# Patient Record
Sex: Male | Born: 1985 | Race: White | Hispanic: Yes | Marital: Married | Smoking: Never smoker
Health system: Southern US, Community
[De-identification: ages and names within clinical notes are randomized; demographics above are authoritative.]

## PROBLEM LIST (undated history)

## (undated) DIAGNOSIS — K603 Anal fistula: Secondary | ICD-10-CM

---

## 2006-09-06 ENCOUNTER — Emergency Department (HOSPITAL_COMMUNITY): Admission: EM | Admit: 2006-09-06 | Discharge: 2006-09-06 | Payer: Self-pay | Admitting: Emergency Medicine

## 2006-09-12 ENCOUNTER — Encounter (INDEPENDENT_AMBULATORY_CARE_PROVIDER_SITE_OTHER): Payer: Self-pay | Admitting: *Deleted

## 2011-01-06 LAB — DIFFERENTIAL
Basophils Relative: 0
Eosinophils Absolute: 0
Lymphs Abs: 0.9
Neutrophils Relative %: 84 — ABNORMAL HIGH

## 2011-01-06 LAB — URINALYSIS, ROUTINE W REFLEX MICROSCOPIC
Ketones, ur: 15 — AB
Nitrite: NEGATIVE
Protein, ur: NEGATIVE
Urobilinogen, UA: 0.2
pH: 5.5

## 2011-01-06 LAB — COMPREHENSIVE METABOLIC PANEL
ALT: 12
CO2: 25
Calcium: 9.7
GFR calc non Af Amer: >60
Glucose, Bld: 114 — ABNORMAL HIGH
Sodium: 138

## 2011-01-06 LAB — LIPASE, BLOOD: Lipase: 21

## 2011-01-06 LAB — CBC
HCT: 47
Hemoglobin: 15.6
MCHC: 33.3
MCV: 81.4
RBC: 5.77

## 2017-04-13 ENCOUNTER — Ambulatory Visit (HOSPITAL_COMMUNITY)
Admission: EM | Admit: 2017-04-13 | Discharge: 2017-04-13 | Disposition: A | Discharge status: Home / Self Care | Payer: Self-pay | Attending: Family Medicine | Admitting: Family Medicine

## 2017-04-13 ENCOUNTER — Encounter (HOSPITAL_COMMUNITY): Payer: Self-pay | Admitting: Emergency Medicine

## 2017-04-13 DIAGNOSIS — W260XXA Contact with knife, initial encounter: Secondary | ICD-10-CM

## 2017-04-13 DIAGNOSIS — Z23 Encounter for immunization: Secondary | ICD-10-CM

## 2017-04-13 DIAGNOSIS — S61211A Laceration without foreign body of left index finger without damage to nail, initial encounter: Secondary | ICD-10-CM

## 2017-04-13 HISTORY — PX: LACERATION REPAIR: SHX5168

## 2017-04-13 MED ORDER — TETANUS-DIPHTH-ACELL PERTUSSIS 5-2.5-18.5 LF-MCG/0.5 IM SUSP
INTRAMUSCULAR | Status: AC
  Filled 2017-04-13: qty 0.5

## 2017-04-13 MED ORDER — TETANUS-DIPHTH-ACELL PERTUSSIS 5-2.5-18.5 LF-MCG/0.5 IM SUSP
0.5000 mL | Freq: Once | INTRAMUSCULAR | Status: AC
  Administered 2017-04-13: 0.5 mL via INTRAMUSCULAR

## 2017-04-13 NOTE — ED Notes (Signed)
Patient says he will need tetanus

## 2017-04-13 NOTE — ED Provider Notes (Signed)
[facility]    CSN: [id number] Arrival date & time: 04/13/17  1233     History   Chief Complaint Chief Complaint  Patient presents with  . Extremity Laceration    HPI Edward Lewis is a 32 y.o. male presenting with wound to left index finger. He was cutting with a knife and accidentally cut his finger. Accident happened this morning. Bleeding controlled with pressure. No numbness or tingling.   HPI  History reviewed. No pertinent past medical history.  There are no active problems to display for this patient.   History reviewed. No pertinent surgical history.     Home Medications    Prior to Admission medications   Not on File    Family History History reviewed. No pertinent family history.  Social History Social History   Tobacco Use  . Smoking status: Never Smoker  . Smokeless tobacco: Never Used  Substance Use Topics  . Alcohol use: Not on file  . Drug use: Not on file     Allergies   Patient has no known allergies.   Review of Systems Review of Systems  Constitutional: Negative for fatigue and fever.  Skin: Positive for wound. Negative for color change and pallor.  Neurological: Negative for dizziness, syncope, weakness, light-headedness, numbness and headaches.     Physical Exam Triage Vital Signs ED Triage Vitals  Enc Vitals Group     BP 04/13/17 1320 120/75     Pulse Rate 04/13/17 1318 64     Resp 04/13/17 1318 20     Temp 04/13/17 1318 98.4 F (36.9 C)     Temp Source 04/13/17 1318 Oral     SpO2 04/13/17 1318 100 %     Weight --      Height --      Head Circumference --      Peak Flow --      Pain Score 04/13/17 1319 2     Pain Loc --      Pain Edu? --      Excl. in GC? --    No data found.  Updated Vital Signs BP 120/75 (BP Location: Left Arm)   Pulse 66   Temp 98.4 F (36.9 C) (Oral)   Resp 20   SpO2 100%   Visual Acuity Right Eye Distance:   Left Eye Distance:   Bilateral Distance:     Right Eye Near:   Left Eye Near:    Bilateral Near:     Physical Exam  Constitutional: He appears well-developed and well-nourished. No distress.  HENT:  Head: Normocephalic and atraumatic.  Skin:  1 cm area to palmar surface of left index finger- shaved area. Not actively bleeding. Just inferior small 0.5-1 cm cut, 1 mm deep. Skin does not gape with pulling apart, no flap appreciated with movement of edges.      UC Treatments / Results  Labs (all labs ordered are listed, but only abnormal results are displayed) Labs Reviewed - No data to display  EKG  EKG Interpretation None       Radiology No results found.  Procedures Laceration Repair Date/Time: 04/13/2017 2:21 PM Performed by: Levern Kalka, Junius CreamerHallie C, PA-C Authorized by: Mardella LaymanHagler, Brian, MD   Consent:    Consent obtained:  Verbal   Consent given by:  Patient   Risks discussed:  Infection and poor cosmetic result   Alternatives discussed:  No treatment Anesthesia (see MAR for exact dosages):    Anesthesia method:  None Laceration  details:    Location:  Finger   Finger location:  L index finger   Length (cm):  1   Depth (mm):  1 Repair type:    Repair type:  Simple Exploration:    Hemostasis achieved with:  Direct pressure   Wound exploration: wound explored through full range of motion     Wound extent: no foreign bodies/material noted, no muscle damage noted and no tendon damage noted     Contaminated: no   Treatment:    Area cleansed with:  Shur-Clens   Amount of cleaning:  Standard Skin repair:    Repair method: Derma bond and steri strips. Post-procedure details:    Dressing:  Non-adherent dressing Comments:     Shaved area with dermabond applied and steri-strips over inferior laceration   (including critical care time)  Medications Ordered in UC Medications  Tdap (BOOSTRIX) injection 0.5 mL (0.5 mLs Intramuscular Given 04/13/17 1415)     Initial Impression / Assessment and Plan / UC Course  I  have reviewed the triage vital signs and the nursing notes.  Pertinent labs & imaging results that were available during my care of the patient were reviewed by me and considered in my medical decision making (see chart for details).     Wounds not deep enough for suturing, expect healing with steri strips/derma bond. Advised to keep dry and clean. Discussed strict return precautions. Patient verbalized understanding and is agreeable with plan.   Final Clinical Impressions(s) / UC Diagnoses   Final diagnoses:  Laceration of left index finger without foreign body without damage to nail, initial encounter    ED Discharge Orders    None       Controlled Substance Prescriptions [place] Controlled Substance Registry consulted? Not Applicable   [name], [place] 04/13/17 1424

## 2017-04-13 NOTE — ED Triage Notes (Signed)
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 Has a pressure dressing and bleeding is controlled.   Last tetanus = unknown   TAKING MEDS: none   A&O x4... NAD... Ambulatory

## 2017-04-13 NOTE — Discharge Instructions (Signed)
We have updated your tetanus shot today.   I have applied steri strips and skin glue over to protect. Cut was not deep enough for stiches. Do not pull steri strips off, they will fall off on their own in 5-7 days.   Keep area clean and dry.  Please return if you notice increased redness, swelling, or drainage from the area.

## 2017-12-06 ENCOUNTER — Encounter (HOSPITAL_COMMUNITY): Payer: Self-pay

## 2017-12-06 ENCOUNTER — Ambulatory Visit (HOSPITAL_COMMUNITY)
Admission: EM | Admit: 2017-12-06 | Discharge: 2017-12-06 | Disposition: A | Discharge status: Home / Self Care | Payer: Self-pay | Attending: Family Medicine | Admitting: Family Medicine

## 2017-12-06 ENCOUNTER — Other Ambulatory Visit: Payer: Self-pay

## 2017-12-06 DIAGNOSIS — M25562 Pain in left knee: Secondary | ICD-10-CM

## 2017-12-06 DIAGNOSIS — M7042 Prepatellar bursitis, left knee: Secondary | ICD-10-CM

## 2017-12-06 MED ORDER — DOXYCYCLINE HYCLATE 100 MG PO CAPS: 100.0000 mg | ORAL_CAPSULE | Freq: Two times a day (BID) | ORAL | 0 refills | Status: DC

## 2017-12-06 MED ORDER — CEPHALEXIN 500 MG PO CAPS: 500.0000 mg | ORAL_CAPSULE | Freq: Four times a day (QID) | ORAL | 0 refills | Status: DC

## 2017-12-06 MED ORDER — NAPROXEN 500 MG PO TABS: 500.0000 mg | ORAL_TABLET | Freq: Two times a day (BID) | ORAL | 0 refills | Status: DC

## 2017-12-06 NOTE — ED Triage Notes (Signed)
Pt states he has pain in his left knee this started last Friday. 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

## 2017-12-06 NOTE — ED Provider Notes (Signed)
[facility]    CSN: [id number] Arrival date & time: 12/06/17  4696     History   Chief Complaint Chief Complaint  Patient presents with  . Knee Pain    HPI Spanish interpretation via [employer] interpreter Edward Lewis is a 32 y.o. male [place] past medical history presenting today for evaluation of left knee pain and swelling.  Patient states that symptoms have been like this for the past 5 days.  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  He has been taking ibuprofen which has relieved his pain, but feels as if the swelling is worsened and redness is spread.  He is concerned about infection.  Denies difficulty moving the knee.   HPI  History reviewed. No pertinent past medical history.  There are no active problems to display for this patient.   History reviewed. No pertinent surgical history.     Home Medications    Prior to Admission medications   Medication Sig Start Date End Date Taking? Authorizing Provider  cephALEXin (KEFLEX) 500 MG capsule Take 1 capsule (500 mg total) by mouth 4 (four) times daily for 5 days. 12/06/17 12/11/17  Benigno Check C, PA-C  naproxen (NAPROSYN) 500 MG tablet Take 1 tablet (500 mg total) by mouth 2 (two) times daily. 12/06/17   Raea Magallon, Junius Creamer, PA-C    Family History History reviewed. No pertinent family history.  Social History Social History   Tobacco Use  . Smoking status: Never Smoker  . Smokeless tobacco: Never Used  Substance Use Topics  . Alcohol use: Not on file  . Drug use: Not on file     Allergies   Patient has no known allergies.   Review of Systems Review of Systems  Constitutional: Negative for fatigue and fever.  Eyes: Negative for redness, itching and visual disturbance.  Respiratory: Negative for shortness of  breath.   Cardiovascular: Negative for chest pain and leg swelling.  Gastrointestinal: Negative for nausea and vomiting.  Musculoskeletal: Positive for arthralgias and joint swelling. Negative for myalgias.  Skin: Positive for color change. Negative for rash and wound.  Neurological: Negative for dizziness, syncope, weakness, light-headedness and headaches.     Physical Exam Triage Vital Signs ED Triage Vitals  Enc Vitals Group     BP 12/06/17 0946 122/88     Pulse Rate 12/06/17 0946 84     Resp 12/06/17 0946 18     Temp 12/06/17 0946 99.6 F (37.6 C)     Temp Source 12/06/17 0946 Oral     SpO2 12/06/17 0946 100 %     Weight 12/06/17 1018 208 lb (94.3 kg)     Height --      Head Circumference --      Peak Flow --      Pain Score --      Pain Loc --      Pain Edu? --      Excl. in GC? --    No data found.  Updated Vital Signs BP 122/88 (BP Location: Right Arm)   Pulse 84   Temp 99.6 F (37.6 C) (Oral)   Resp 18   Wt 208 lb (94.3 kg)   SpO2 100%   Visual Acuity Right Eye Distance:   Left Eye Distance:   Bilateral Distance:  Right Eye Near:   Left Eye Near:    Bilateral Near:     Physical Exam  Constitutional: He appears well-developed and well-nourished.  HENT:  Head: Normocephalic and atraumatic.  Eyes: Conjunctivae are normal.  Neck: Neck supple.  Cardiovascular: Normal rate and regular rhythm.  No murmur heard. Pulmonary/Chest: Effort normal and breath sounds normal. No respiratory distress.  Abdominal: Soft. There is no tenderness.  Musculoskeletal: He exhibits edema and tenderness.  Left knee with swelling, erythema and warmth, swelling and slight fluctuance above left patellar area, erythema does extend slightly to distal shin full active range of motion of knee  Neurological: He is alert.  Skin: Skin is warm and dry.  Psychiatric: He has a normal mood and affect.  Nursing note and vitals reviewed.    UC Treatments / Results  Labs (all labs  ordered are listed, but only abnormal results are displayed) Labs Reviewed - No data to display  EKG None  Radiology No results found.  Procedures Procedures (including critical care time)  Medications Ordered in UC Medications - No data to display  Initial Impression / Assessment and Plan / UC Course  I have reviewed the triage vital signs and the nursing notes.  Pertinent labs & imaging results that were available during my care of the patient were reviewed by me and considered in my medical decision making (see chart for details).     Patient appears to have a prepatellar bursitis of his left knee, does not seem to be swollen enough to aspirate at this time.  Will recommend anti-inflammatories, will provide Ace wrap and also recommending icing and resting.  Given warmth and extension of erythema onto shin will also begin patient on Keflex to cover for cellulitis.  Vital signs stable, full active range of motion, unlikely septic joint.  Will have patient follow-up if developing any decreased range of motion, redness spreading, worsening pain or swelling.Discussed strict return precautions. Patient verbalized understanding and is agreeable with plan.  Final Clinical Impressions(s) / UC Diagnoses   Final diagnoses:  Acute pain of left knee  Prepatellar bursitis of left knee     Discharge Instructions     Use anti-inflammatories for pain/swelling. You may take up to 800 mg Ibuprofen every 8 hours OR naprosyn twice daily with food. You may supplement Ibuprofen with Tylenol [phone] mg every 8 hours.  Ace wrap to help with swelling Ice and elevate when resting at home Begin Keflex every 6 hours for 5 days  Follow up if pain, redness and swelling worsening   ED Prescriptions    Medication Sig Dispense Auth. Provider   naproxen (NAPROSYN) 500 MG tablet Take 1 tablet (500 mg total) by mouth 2 (two) times daily. 30 tablet Destynie Toomey C, PA-C   doxycycline (VIBRAMYCIN) 100 MG  capsule  (Status: Discontinued) Take 1 capsule (100 mg total) by mouth 2 (two) times daily for 7 days. 14 capsule Chrishawna Farina C, PA-C   cephALEXin (KEFLEX) 500 MG capsule Take 1 capsule (500 mg total) by mouth 4 (four) times daily for 5 days. 20 capsule Tip Atienza C, PA-C     Controlled Substance Prescriptions [employer] Controlled Substance Registry consulted? Not Applicable   Lew DawesWieters, Geselle Cardosa C, [place] 12/06/17 1617

## 2017-12-06 NOTE — Discharge Instructions (Signed)
Use anti-inflammatories for pain/swelling. You may take up to 800 mg Ibuprofen every 8 hours OR naprosyn twice daily with food. You may supplement Ibuprofen with Tylenol [phone] mg every 8 hours.  Ace wrap to help with swelling Ice and elevate when resting at home Begin Keflex every 6 hours for 5 days  Follow up if pain, redness and swelling worsening

## 2017-12-11 ENCOUNTER — Emergency Department (HOSPITAL_COMMUNITY)
Admission: EM | Admit: 2017-12-11 | Discharge: 2017-12-11 | Disposition: A | Discharge status: Home / Self Care | Payer: Self-pay | Attending: Emergency Medicine | Admitting: Emergency Medicine

## 2017-12-11 ENCOUNTER — Emergency Department (HOSPITAL_COMMUNITY): Payer: Self-pay

## 2017-12-11 ENCOUNTER — Other Ambulatory Visit: Payer: Self-pay

## 2017-12-11 ENCOUNTER — Encounter (HOSPITAL_COMMUNITY): Payer: Self-pay | Admitting: Emergency Medicine

## 2017-12-11 DIAGNOSIS — Z79899 Other long term (current) drug therapy: Secondary | ICD-10-CM | POA: Insufficient documentation

## 2017-12-11 DIAGNOSIS — M7042 Prepatellar bursitis, left knee: Secondary | ICD-10-CM | POA: Insufficient documentation

## 2017-12-11 DIAGNOSIS — Y939 Activity, unspecified: Secondary | ICD-10-CM | POA: Insufficient documentation

## 2017-12-11 MED ORDER — DOXYCYCLINE HYCLATE 100 MG PO CAPS: 100.0000 mg | ORAL_CAPSULE | Freq: Two times a day (BID) | ORAL | 0 refills | Status: DC

## 2017-12-11 NOTE — ED Provider Notes (Signed)
[facility] EMERGENCY DEPARTMENT Provider Note   CSN: [id number] Arrival date & time: 12/11/17  1513     History   Chief Complaint Chief Complaint  Patient presents with  . Knee Pain    HPI Foxx Klarich is a 32 y.o. male.  Patient is a 32 year old male who presents with left knee pain.  He states it started hurting about a week ago.  He has had some swelling and redness over his left kneecap.  He denies any fevers.  He states it does not hurt when it is just lying there but when he starts walking notes when it sore.  He does work on his knees a lot at work.  He was seen in urgent care on September 17 and diagnosed with pre-patellar bursitis.  He was started on Keflex.  He states it has not gotten any worse but has not really gotten any better.  He is also taking Naprosyn.  He denies any fevers.  No increased swelling of the knee.  No history of similar problems in the past.  No other joint swelling.     History reviewed. No pertinent past medical history.  There are no active problems to display for this patient.   History reviewed. No pertinent surgical history.      Home Medications    Prior to Admission medications   Medication Sig Start Date End Date Taking? Authorizing Provider  doxycycline (VIBRAMYCIN) 100 MG capsule Take 1 capsule (100 mg total) by mouth 2 (two) times daily. One po bid x 7 days 12/11/17   Rolan Bucco, MD  naproxen (NAPROSYN) 500 MG tablet Take 1 tablet (500 mg total) by mouth 2 (two) times daily. 12/06/17   Wieters, Junius Creamer, PA-C    Family History No family history on file.  Social History Social History   Tobacco Use  . Smoking status: Never Smoker  . Smokeless tobacco: Never Used  Substance Use Topics  . Alcohol use: Yes  . Drug use: Never     Allergies   Patient has no known allergies.   Review of Systems Review of Systems  Constitutional: Negative for fever.  Gastrointestinal: Negative for  nausea and vomiting.  Musculoskeletal: Positive for arthralgias and joint swelling. Negative for back pain and neck pain.  Skin: Negative for wound.  Neurological: Negative for weakness, numbness and headaches.     Physical Exam Updated Vital Signs BP 110/70 (BP Location: Right Arm)   Pulse 73   Temp 98.5 F (36.9 C) (Oral)   Resp 16   Ht 5\' 4"  (1.626 m)   Wt 95.3 kg   SpO2 100%   BMI 36.05 kg/m   Physical Exam  Constitutional: He is oriented to person, place, and time. He appears well-developed and well-nourished.  HENT:  Head: Normocephalic and atraumatic.  Neck: Normal range of motion. Neck supple.  Cardiovascular: Normal rate.  Pulmonary/Chest: Effort normal.  Musculoskeletal: He exhibits edema and tenderness.  Patient has some mild swelling over the left patellar area.  There is no tenderness on palpation.  No wounds are noted.  No significant joint effusion is palpated.  Ligaments are grossly intact.  Is able to do a straight leg raise.  He has no pain on range of motion of the knee.  He has some mild swelling as well extending down into his lower leg and up into his thigh but no redness in these areas.  He is neurovascular intact distally.  Neurological: He is alert  and oriented to person, place, and time.  Skin: Skin is warm and dry.  Psychiatric: He has a normal mood and affect.       ED Treatments / Results  Labs (all labs ordered are listed, but only abnormal results are displayed) Labs Reviewed - No data to display  EKG None  Radiology Dg Knee Complete 4 Views Left  Result Date: 12/11/2017 CLINICAL DATA:  Anterior left knee pain and swelling. EXAM: LEFT KNEE - COMPLETE 4+ VIEW COMPARISON:  None. FINDINGS: Anterior soft tissue swelling overlying the patella and infrapatellar region. No bony abnormality. No fracture, subluxation or dislocation. No joint effusion. IMPRESSION: Anterior soft tissue swelling.  No acute bony abnormality. Electronically Signed    By: Charlett NoseKevin  Dover M.D.   On: 12/11/2017 17:46    Procedures Procedures (including critical care time)  Medications Ordered in ED Medications - No data to display   Initial Impression / Assessment and Plan / ED Course  I have reviewed the triage vital signs and the nursing notes.  Pertinent labs & imaging results that were available during my care of the patient were reviewed by me and considered in my medical decision making (see chart for details).     Patient has mild swelling to the anterior left knee.  I doubt that this is septic.  There is no tenderness on palpation of the area.  There is no indication of septic joint.  I do not really palpate significant fluid to the area that can be aspirated.  Potentially he could have cellulitis but I feel this is also a low possibility given that it is really not tender on exam.  I feel this is more likely inflammatory process.  However I did switch him from Keflex to doxycycline.  I advised him to ice the area.  He was given Ace wrap for compression.  He will continue Naprosyn.  He was given a referral to follow-up with orthopedics.  He was given strict return precautions.  Final Clinical Impressions(s) / ED Diagnoses   Final diagnoses:  Prepatellar bursitis of left knee    ED Discharge Orders         Ordered    doxycycline (VIBRAMYCIN) 100 MG capsule  2 times daily     12/11/17 1820           Rolan BuccoBelfi, Lonzy Mato, MD 12/11/17 1827

## 2017-12-11 NOTE — ED Triage Notes (Signed)
C/o pain and swelling to L knee since 9/13.  [facility] with prepatellar burs[facility].  Taking Naproxen and Keflex without any relief.

## 2017-12-11 NOTE — ED Notes (Signed)
Patient verbalizes understanding of medications and discharge instructions. No further questions at this time. VSS and patient ambulatory at discharge.   

## 2018-01-14 ENCOUNTER — Emergency Department (HOSPITAL_COMMUNITY)
Admission: EM | Admit: 2018-01-14 | Discharge: 2018-01-14 | Disposition: A | Discharge status: Home / Self Care | Payer: Self-pay | Attending: Emergency Medicine | Admitting: Emergency Medicine

## 2018-01-14 ENCOUNTER — Encounter (HOSPITAL_COMMUNITY): Payer: Self-pay | Admitting: Emergency Medicine

## 2018-01-14 ENCOUNTER — Emergency Department (HOSPITAL_COMMUNITY): Payer: Self-pay

## 2018-01-14 DIAGNOSIS — K6289 Other specified diseases of anus and rectum: Secondary | ICD-10-CM | POA: Insufficient documentation

## 2018-01-14 LAB — URINALYSIS, ROUTINE W REFLEX MICROSCOPIC
BILIRUBIN URINE: NEGATIVE
GLUCOSE, UA: NEGATIVE mg/dL
Hgb urine dipstick: NEGATIVE
KETONES UR: NEGATIVE mg/dL
Leukocytes, UA: NEGATIVE
NITRITE: NEGATIVE
PH: 6 (ref 5.0–8.0)
Protein, ur: NEGATIVE mg/dL
Specific Gravity, Urine: 1.019 (ref 1.005–1.030)

## 2018-01-14 LAB — BASIC METABOLIC PANEL
ANION GAP: 10 (ref 5–15)
BUN: 12 mg/dL (ref 6–20)
CALCIUM: 9.4 mg/dL (ref 8.9–10.3)
CO2: 27 mmol/L (ref 22–32)
Chloride: 101 mmol/L (ref 98–111)
Creatinine, Ser: 0.81 mg/dL (ref 0.61–1.24)
GLUCOSE: 100 mg/dL — AB (ref 70–99)
Potassium: 3.7 mmol/L (ref 3.5–5.1)
Sodium: 138 mmol/L (ref 135–145)

## 2018-01-14 LAB — CBC WITH DIFFERENTIAL/PLATELET
Abs Immature Granulocytes: 0.04 10*3/uL (ref 0.00–0.07)
BASOS ABS: 0 10*3/uL (ref 0.0–0.1)
BASOS PCT: 0 %
EOS ABS: 0 10*3/uL (ref 0.0–0.5)
Eosinophils Relative: 0 %
HEMATOCRIT: 45 % (ref 39.0–52.0)
Hemoglobin: 14.2 g/dL (ref 13.0–17.0)
IMMATURE GRANULOCYTES: 0 %
LYMPHS ABS: 2.4 10*3/uL (ref 0.7–4.0)
Lymphocytes Relative: 22 %
MCH: 27 pg (ref 26.0–34.0)
MCHC: 31.6 g/dL (ref 30.0–36.0)
MCV: 85.6 fL (ref 80.0–100.0)
Monocytes Absolute: 0.9 10*3/uL (ref 0.1–1.0)
Monocytes Relative: 8 %
NEUTROS PCT: 70 %
NRBC: 0 % (ref 0.0–0.2)
Neutro Abs: 7.6 10*3/uL (ref 1.7–7.7)
PLATELETS: 316 10*3/uL (ref 150–400)
RBC: 5.26 MIL/uL (ref 4.22–5.81)
RDW: 13.8 % (ref 11.5–15.5)
WBC: 11 10*3/uL — AB (ref 4.0–10.5)

## 2018-01-14 MED ORDER — IOPAMIDOL (ISOVUE-300) INJECTION 61%
100.0000 mL | Freq: Once | INTRAVENOUS | Status: AC | PRN
  Administered 2018-01-14: 100 mL via INTRAVENOUS

## 2018-01-14 MED ORDER — HYDROCORTISONE ACETATE 25 MG RE SUPP: 25.0000 mg | Freq: Two times a day (BID) | RECTAL | 0 refills | Status: DC

## 2018-01-14 MED ORDER — OXYCODONE-ACETAMINOPHEN 5-325 MG PO TABS: 1.0000 | ORAL_TABLET | Freq: Four times a day (QID) | ORAL | 0 refills | Status: DC | PRN

## 2018-01-14 MED ORDER — IOPAMIDOL (ISOVUE-300) INJECTION 61%
INTRAVENOUS | Status: AC
  Filled 2018-01-14: qty 100

## 2018-01-14 MED ORDER — SODIUM CHLORIDE 0.9 % IJ SOLN
INTRAMUSCULAR | Status: AC
  Filled 2018-01-14: qty 50

## 2018-01-14 NOTE — ED Provider Notes (Signed)
[facility]-EMERGENCY DEPT Provider Note   CSN: [id number] Arrival date & time: 01/14/18  1224     History   Chief Complaint Chief Complaint  Patient presents with  . Rectal Pain    HPI Cebert Dettmann is a 32 y.o. male.  HPI   Valgene Deloatch is a 32 year old male with no significant past medical history who presents to the emergency department for evaluation of rectal pain.  Patient reports that he has had gradually worsening rectal pain for the past 3 weeks.  Pain is constant and feels "hot."  Worse with sitting upright, walking or having a bowel movement.  He reports history of chronic diarrhea with 4 episodes of nonbloody diarrhea daily, denies any change from his baseline.  He had a bowel movement earlier today which was normal for him.  He denies fever, chills, nausea/vomiting, abdominal pain, testicular pain or scrotal swelling, chest pain, shortness of breath.   History reviewed. No pertinent past medical history.  There are no active problems to display for this patient.   History reviewed. No pertinent surgical history.      Home Medications    Prior to Admission medications   Medication Sig Start Date End Date Taking? Authorizing Provider  acetaminophen (TYLENOL) 500 MG tablet Take 500 mg by mouth every 6 (six) hours as needed for mild pain.   Yes [provider]  naproxen (NAPROSYN) 500 MG tablet Take 1 tablet (500 mg total) by mouth 2 (two) times daily. 12/06/17  Yes Wieters, Hallie C, PA-C  phenylephrine-shark liver oil-mineral oil-petrolatum (PREPARATION H) 0.25-3-14-71.9 % rectal ointment Place 1 application rectally 2 (two) times daily as needed for hemorrhoids.   Yes [provider]  doxycycline (VIBRAMYCIN) 100 MG capsule Take 1 capsule (100 mg total) by mouth 2 (two) times daily. One po bid x 7 days Patient not taking: Reported on 01/14/2018 12/11/17   Rolan Bucco, MD    Family History No family history  on file.  Social History Social History   Tobacco Use  . Smoking status: Never Smoker  . Smokeless tobacco: Never Used  Substance Use Topics  . Alcohol use: Yes  . Drug use: Never     Allergies   Patient has no known allergies.   Review of Systems Review of Systems  Constitutional: Negative for chills and fever.  Respiratory: Negative for shortness of breath.   Cardiovascular: Negative for chest pain.  Gastrointestinal: Positive for diarrhea and rectal pain. Negative for abdominal pain, anal bleeding, blood in stool, nausea and vomiting.  Genitourinary: Negative for difficulty urinating, dysuria, frequency, hematuria, scrotal swelling and testicular pain.  Musculoskeletal: Negative for back pain.  Skin: Negative for color change.  Psychiatric/Behavioral: Negative for agitation.  All other systems reviewed and are negative.    Physical Exam Updated Vital Signs BP 122/85 (BP Location: Left Arm)   Pulse 74   Temp 98.4 F (36.9 C) (Oral)   Resp 18   SpO2 99%   Physical Exam  Constitutional: He is oriented to person, place, and time. He appears well-developed and well-nourished. No distress.  No acute distress, non-toxic appearing.   HENT:  Head: Normocephalic and atraumatic.  Mouth/Throat: Oropharynx is clear and moist.  Mucous membranes moist.   Eyes: Right eye exhibits no discharge. Left eye exhibits no discharge.  Neck: Normal range of motion.  Cardiovascular: Normal rate and regular rhythm.  Pulmonary/Chest: Effort normal and breath sounds normal. No stridor. No respiratory distress. He has no wheezes. He  has no rales.  Abdominal:  Abdomen soft and non-distended. Bowel sounds normoactive. Abdomen non-tender to palpation.   Genitourinary:     Genitourinary Comments: Chaperone present. Tender to palpation left of anus. There is no overlying rash on the skin, palpable mass or area of fluctuance. No visible hemorrhoids or fissures. Internal exam with significant  perirectal tenderness on the left.   Musculoskeletal: Normal range of motion.  Neurological: He is alert and oriented to person, place, and time. Coordination normal.  Skin: Skin is warm and dry. He is not diaphoretic.  Psychiatric: He has a normal mood and affect. His behavior is normal.  Nursing note and vitals reviewed.    ED Treatments / Results  Labs (all labs ordered are listed, but only abnormal results are displayed) Labs Reviewed  CBC WITH DIFFERENTIAL/PLATELET - Abnormal; Notable for the following components:      Result Value   WBC 11.0 (*)    All other components within normal limits  BASIC METABOLIC PANEL - Abnormal; Notable for the following components:   Glucose, Bld 100 (*)    All other components within normal limits  URINALYSIS, ROUTINE W REFLEX MICROSCOPIC    EKG None  Radiology Ct Pelvis W Contrast  Result Date: 01/14/2018 CLINICAL DATA:  Pt c/o left side rectal pain for 2 weeks worse with sitting and walking. Tried tylenol and cream for hemorrhoids but didn't help. Denies blood in stools or rectal bleeding. EXAM: CT PELVIS WITH CONTRAST TECHNIQUE: Multidetector CT imaging of the pelvis was performed using the standard protocol following the bolus administration of intravenous contrast. CONTRAST:  ISOVUE-300 IOPAMIDOL (ISOVUE-300) INJECTION 61% COMPARISON:  None. FINDINGS: Urinary Tract: Bladder is unremarkable. Distal ureters normal in course and in caliber. Bowel: No rectal wall thickening or adjacent inflammation. Visualized colon and small bowel are unremarkable. Normal appendix visualized. There is a subtle low-density lesion projecting within the anus measuring 12 mm the may be due to a hemorrhoid. Vascular/Lymphatic: No vascular abnormality. No pathologically enlarged lymph nodes. Reproductive:  Normal sized prostate gland. Other:  None. Musculoskeletal: No skeletal abnormality. IMPRESSION: 1. Small apparent cystic lesion measuring 12 mm projects within  the anus. This could potentially reflect an inflamed anal gland or hemorrhoid. 2. No other abnormality.  No rectal wall thickening or inflammation. Electronically Signed   By: Amie Portland M.D.   On: 01/14/2018 19:49    Procedures Procedures (including critical care time)  Medications Ordered in ED Medications  iopamidol (ISOVUE-300) 61 % injection 100 mL (100 mLs Intravenous Contrast Given 01/14/18 1931)     Initial Impression / Assessment and Plan / ED Course  I have reviewed the triage vital signs and the nursing notes.  Pertinent labs & imaging results that were available during my care of the patient were reviewed by me and considered in my medical decision making (see chart for details).     Patient presents with rectal pain ongoing for 3wks, worse with bowel movement. Denies constipation. No fevers, n/v, hematochezia or melena. On exam he has significant left sided perirectal tenderness without visible or palpable mass or hemorrhoid. No abdominal tenderness. Lab work with a slight leukocytosis of 11.0. CT pelvis ordered given concern for perirectal abscess. This reveals small cystic lesion measuring 1.2cm within the anus which is likely inflamed anal gland vs. hemorrhoid. Plan to treat with steroid suppository and sitz bath. Will also d/c with very short course of percocet for break through pain. Discussed return precautions and he agrees.   Final  Clinical Impressions(s) / ED Diagnoses   Final diagnoses:  Rectal pain    ED Discharge Orders         Ordered    hydrocortisone (ANUSOL-HC) 25 MG suppository  2 times daily     01/14/18 2104    oxyCODONE-acetaminophen (PERCOCET/ROXICET) 5-325 MG tablet  Every 6 hours PRN     01/14/18 2104           Kellie Shropshire, PA-C 01/15/18 2229    Alvira Monday, MD 01/18/18 2134

## 2018-01-14 NOTE — Discharge Instructions (Addendum)
Scan shows what appears to be a small hemorrhoid inside your anus.   Sit in warm water for 15 minutes twice a day to help with your symptoms.   Use rectal suppository up to twice a day. I have attached a coupon- you have to go to [employer] to redeem this coupon. I have also written you a prescription for percocet. Remember the percocet can make you sleepy so please do not drive, work or drink alcohol while taking.   Come back to the ER if you have any new or worsening symptoms like fever, vomiting, abdominal pain.

## 2018-01-14 NOTE — ED Notes (Signed)
Family at bedside. 

## 2018-01-14 NOTE — ED Triage Notes (Signed)
Pt c/o left side rectal pain for 2 weeks worse with sitting and walking. Tried tylenol and cream for hemorrhoids but didn't help. Denies blood in stools or rectal bleeding.

## 2018-02-02 ENCOUNTER — Other Ambulatory Visit: Payer: Self-pay

## 2018-02-02 ENCOUNTER — Encounter (HOSPITAL_COMMUNITY): Payer: Self-pay

## 2018-02-02 ENCOUNTER — Ambulatory Visit (HOSPITAL_COMMUNITY)
Admission: EM | Admit: 2018-02-02 | Discharge: 2018-02-02 | Disposition: A | Discharge status: Home / Self Care | Payer: Self-pay | Attending: Family Medicine | Admitting: Family Medicine

## 2018-02-02 DIAGNOSIS — K611 Rectal abscess: Secondary | ICD-10-CM | POA: Insufficient documentation

## 2018-02-02 DIAGNOSIS — Z79899 Other long term (current) drug therapy: Secondary | ICD-10-CM | POA: Insufficient documentation

## 2018-02-02 DIAGNOSIS — L0291 Cutaneous abscess, unspecified: Secondary | ICD-10-CM

## 2018-02-02 DIAGNOSIS — Z791 Long term (current) use of non-steroidal anti-inflammatories (NSAID): Secondary | ICD-10-CM | POA: Insufficient documentation

## 2018-02-02 MED ORDER — DOXYCYCLINE HYCLATE 100 MG PO CAPS: 100.0000 mg | ORAL_CAPSULE | Freq: Two times a day (BID) | ORAL | 0 refills | Status: DC

## 2018-02-02 NOTE — Discharge Instructions (Signed)
Soak bottom in warm water twice a day to promote drainage.  Complete course of antibiotics.  Please follow up with a primary care provider and/or surgery in the next 1 week for a recheck as if not improving may need further intervention.  If develop increased pain, fevers, nausea, vomiting, unable to have a BM or otherwise worsening please go to the Er.

## 2018-02-02 NOTE — ED Triage Notes (Signed)
Pt states she went to the ER 2 weeks ago for his hemorrhoids and sense then he thinks he had a boil to burst near his anus and now is draining.

## 2018-02-02 NOTE — ED Provider Notes (Signed)
[facility]    CSN: [id number] Arrival date & time: 02/02/18  [id number]     History   Chief Complaint Chief Complaint  Patient presents with  . Abscess    HPI Edward Lewis is a 32 y.o. male.   Edward Lewis presents with complaints of draining boil near rectum. Was seen and evaluated in ED 10/26 due to rectal pain. Slight elevation in WBC at that time, CT completed. Area of concern for enlarged anal gland vs hemorrhoid. Patient sent home with supportive cares, hydrocortisone suppositories and sitz baths. Patient states that the area began to swell a few days later and then started draining. Has had drainage for approximately a week. No longer with pain. No fevers or chills. BM's have been normal, no change. Denies any previous similar. Sexually active with wife, denies anal intercourse. Has been using topical antibiotic cream. Without contributing medical history.      ,ROS per HPI     History reviewed. No pertinent past medical history.  There are no active problems to display for this patient.   History reviewed. No pertinent surgical history.     Home Medications    Prior to Admission medications   Medication Sig Start Date End Date Taking? Authorizing Provider  acetaminophen (TYLENOL) 500 MG tablet Take 500 mg by mouth every 6 (six) hours as needed for mild pain.    [provider]  doxycycline (VIBRAMYCIN) 100 MG capsule Take 1 capsule (100 mg total) by mouth 2 (two) times daily. 02/02/18   Georgetta HaberBurky, Natalie B, NP  hydrocortisone (ANUSOL-HC) 25 MG suppository Place 1 suppository (25 mg total) rectally 2 (two) times daily. 01/14/18   Kellie ShropshireShrosbree, Emily J, PA-C  naproxen (NAPROSYN) 500 MG tablet Take 1 tablet (500 mg total) by mouth 2 (two) times daily. 12/06/17   Wieters, Hallie C, PA-C  oxyCODONE-acetaminophen (PERCOCET/ROXICET) 5-325 MG tablet Take 1 tablet by mouth every 6 (six) hours as needed for severe pain. 01/14/18   Kellie ShropshireShrosbree, Emily J, PA-C    phenylephrine-shark liver oil-mineral oil-petrolatum (PREPARATION H) 0.25-3-14-71.9 % rectal ointment Place 1 application rectally 2 (two) times daily as needed for hemorrhoids.    [provider]    Family History History reviewed. No pertinent family history.  Social History Social History   Tobacco Use  . Smoking status: Never Smoker  . Smokeless tobacco: Never Used  Substance Use Topics  . Alcohol use: Yes  . Drug use: Never     Allergies   Patient has no known allergies.   Review of Systems Review of Systems   Physical Exam Triage Vital Signs ED Triage Vitals  Enc Vitals Group     BP 02/02/18 1034 117/80     Pulse Rate 02/02/18 1034 84     Resp 02/02/18 1034 18     Temp 02/02/18 1034 98.3 F (36.8 C)     Temp Source 02/02/18 1034 Oral     SpO2 02/02/18 1034 99 %     Weight 02/02/18 1035 205 lb (93 kg)     Height --      Head Circumference --      Peak Flow --      Pain Score 02/02/18 1035 0     Pain Loc --      Pain Edu? --      Excl. in GC? --    No data found.  Updated Vital Signs BP 117/80 (BP Location: Left Arm)   Pulse 84   Temp 98.3 F (  36.8 C) (Oral)   Resp 18   Wt 205 lb (93 kg)   SpO2 99%   BMI 35.19 kg/m    Physical Exam  Constitutional: He is oriented to person, place, and time. He appears well-developed and well-nourished.  Cardiovascular: Normal rate and regular rhythm.  Pulmonary/Chest: Effort normal and breath sounds normal.  Genitourinary: Rectal exam shows no external hemorrhoid.     Genitourinary Comments: Chaperone present;  soft area left of anus without palpable fluctuance or induration but thick white discharge expressed on palpation; non tender; culture obtained   Neurological: He is alert and oriented to person, place, and time.  Skin: Skin is warm and dry.     UC Treatments / Results  Labs (all labs ordered are listed, but only abnormal results are displayed) Labs Reviewed  AEROBIC CULTURE  (SUPERFICIAL SPECIMEN)    EKG None  Radiology No results found.  Procedures Procedures (including critical care time)  Medications Ordered in UC Medications - No data to display  Initial Impression / Assessment and Plan / UC Course  I have reviewed the triage vital signs and the nursing notes.  Pertinent labs & imaging results that were available during my care of the patient were reviewed by me and considered in my medical decision making (see chart for details).     Draining perirectal abscess, suspect this is what was visualized on CT. No fevers, no tachycardia, pain has improved since draining. abx initiated. Encouraged continued sitz baths. Encouraged close follow up with PCP and/or gen surg as may need I&d if no improvement or if worsening. Return precautions provided. Patient verbalized understanding and agreeable to plan.    Final Clinical Impressions(s) / UC Diagnoses   Final diagnoses:  Abscess     Discharge Instructions     Soak bottom in warm water twice a day to promote drainage.  Complete course of antibiotics.  Please follow up with a primary care provider and/or surgery in the next 1 week for a recheck as if not improving may need further intervention.  If develop increased pain, fevers, nausea, vomiting, unable to have a BM or otherwise worsening please go to the Er.    ED Prescriptions    Medication Sig Dispense Auth. Provider   doxycycline (VIBRAMYCIN) 100 MG capsule Take 1 capsule (100 mg total) by mouth 2 (two) times daily. 20 capsule Georgetta Haber, NP     Controlled Substance Prescriptions [place] Controlled Substance Registry consulted? Not Applicable   Georgetta Haber, NP 02/02/18 1106

## 2018-02-05 LAB — AEROBIC CULTURE W GRAM STAIN (SUPERFICIAL SPECIMEN)

## 2018-02-05 LAB — AEROBIC CULTURE  (SUPERFICIAL SPECIMEN)

## 2018-02-26 ENCOUNTER — Encounter (HOSPITAL_COMMUNITY): Payer: Self-pay | Admitting: Emergency Medicine

## 2018-02-26 ENCOUNTER — Emergency Department (HOSPITAL_COMMUNITY)
Admission: EM | Admit: 2018-02-26 | Discharge: 2018-02-26 | Disposition: A | Discharge status: Home / Self Care | Payer: Self-pay | Attending: Emergency Medicine | Admitting: Emergency Medicine

## 2018-02-26 DIAGNOSIS — K603 Anal fistula: Secondary | ICD-10-CM | POA: Insufficient documentation

## 2018-02-26 DIAGNOSIS — Z79899 Other long term (current) drug therapy: Secondary | ICD-10-CM | POA: Insufficient documentation

## 2018-02-26 NOTE — ED Notes (Signed)
Pt. Is in no distress. Dr. Ethelda ChickJacubowitz has a lengthy conversation and exam utilizing the [employer] translator.

## 2018-02-26 NOTE — Discharge Instructions (Signed)
Call the [facility] tomorrow to schedule an office appointment.  Tell office staff that you were seen in the emergency department

## 2018-02-26 NOTE — ED Provider Notes (Signed)
[facility]-EMERGENC[facility]PT Provider Note   CSN: [id number] Arrival date & time: 02/26/18  0807    History is obtained through medical interpreter patient speaks little English History   Chief Complaint Chief Complaint  Patient presents with  . rectal drainage    HPI Edward Lewis is a 32 y.o. male.  HPI patient noted a "boil" immediately lateral to the anus October 2019, which subsequently "popped" he subsequently went to [facility] urgent care center 02/02/2018, prescribed topical hydrocortisone,, sits baths, antibiotics..  He continues to have drainage which looks like "water" through the wound.  He reports minimal discomfort with bowel movements though he is able to have bowel movements normally.  No abdominal pain.  No fever.  No other associated symptoms nothing makes symptoms better or worse  History reviewed. No pertinent past medical history.  There are no active problems to display for this patient.   History reviewed. No pertinent surgical history.      Home Medications    Prior to Admission medications   Medication Sig Start Date End Date Taking? Authorizing Provider  acetaminophen (TYLENOL) 500 MG tablet Take 500 mg by mouth every 6 (six) hours as needed for mild pain.    [provider]  doxycycline (VIBRAMYCIN) 100 MG capsule Take 1 capsule (100 mg total) by mouth 2 (two) times daily. 02/02/18   Georgetta Haber, NP  hydrocortisone (ANUSOL-HC) 25 MG suppository Place 1 suppository (25 mg total) rectally 2 (two) times daily. 01/14/18   Kellie Shropshire, PA-C  naproxen (NAPROSYN) 500 MG tablet Take 1 tablet (500 mg total) by mouth 2 (two) times daily. 12/06/17   Wieters, Hallie C, PA-C  oxyCODONE-acetaminophen (PERCOCET/ROXICET) 5-325 MG tablet Take 1 tablet by mouth every 6 (six) hours as needed for severe pain. 01/14/18   Kellie Shropshire, PA-C  phenylephrine-shark liver oil-mineral oil-petrolatum (PREPARATION H)  0.25-3-14-71.9 % rectal ointment Place 1 application rectally 2 (two) times daily as needed for hemorrhoids.    [provider]    Family History No family history on file.  Social History Social History   Tobacco Use  . Smoking status: Never Smoker  . Smokeless tobacco: Never Used  Substance Use Topics  . Alcohol use: Yes  . Drug use: Never     Allergies   Patient has no known allergies.   Review of Systems Review of Systems  Constitutional: Negative.   HENT: Negative.   Respiratory: Negative.   Cardiovascular: Positive for chest pain.       Syncope  Gastrointestinal: Negative.   Musculoskeletal: Negative.   Skin: Positive for wound.       Perianal wound  Allergic/Immunologic: Negative.   Neurological: Negative.   Psychiatric/Behavioral: Negative.   All other systems reviewed and are negative.    Physical Exam Updated Vital Signs BP 115/86   Pulse 83   Temp 98.5 F (36.9 C)   Resp 15   SpO2 100%   Physical Exam  Constitutional: He appears well-developed and well-nourished.  HENT:  Head: Normocephalic and atraumatic.  Eyes: Pupils are equal, round, and reactive to light. Conjunctivae are normal.  Neck: Neck supple. No tracheal deviation present. No thyromegaly present.  Cardiovascular: Normal rate and regular rhythm.  No murmur heard. Pulmonary/Chest: Effort normal and breath sounds normal.  Abdominal: Soft. Bowel sounds are normal. He exhibits no distension. There is no tenderness.  Genitourinary:  Genitourinary Comments: , Normal male genitalia.  There is a 3 to 4 mm open  wound on left buttock immediately lateral to anus.  No surrounding redness swelling or tenderness.  No active drainage.  Musculoskeletal: Normal range of motion. He exhibits no edema or tenderness.  Neurological: He is alert. Coordination normal.  Skin: Skin is warm and dry. No rash noted.  Psychiatric: He has a normal mood and affect.  Nursing note and vitals  reviewed.    ED Treatments / Results  Labs (all labs ordered are listed, but only abnormal results are displayed) Labs Reviewed - No data to display  EKG None  Radiology No results found.  Procedures Procedures (including critical care time)  Medications Ordered in ED Medications - No data to display   Initial Impression / Assessment and Plan / ED Course  I have reviewed the triage vital signs and the nursing notes.  Pertinent labs & imaging results that were available during my care of the patient were reviewed by me and considered in my medical decision making (see chart for details).     Suspect perianal fistula with nonhealing wound over a few months.  Plan [facility]  Final Clinical Impressions(s) / ED Diagnoses   Final diagnoses:  Perianal fistula    ED Discharge Orders    None       Doug SouJacubowitz, Chriselda Leppert, MD 02/26/18 0900

## 2018-02-26 NOTE — ED Triage Notes (Signed)
Pt speaks Spanish, used [employer] for translation.  Pt reports about month ago has anus pain and had difficulty walking. Pt was told either infection or hemorrhoid and was given medications rectally and orally for hemorrhoids.  Pt reports that he had a blister that broke by itself in rectal area several weeks ago and is still having "yellow liquid oozing out and blood when wipe to strong or injure area". Pt reports that he feels "wet there now". Pt has little pain in blister area.

## 2018-03-06 ENCOUNTER — Ambulatory Visit: Payer: Self-pay | Admitting: General Surgery

## 2018-03-06 NOTE — H&P (View-Only) (Signed)
History of Present Illness Romie Levee MD; 03/06/2018 9:51 AM) The patient is a 32 year old male who presents with a complaint of anal problems. 32 year old male who presents to the office with complaints of a perianal nodule which has been present since September of this year. He reports that he went to the ER for this and it was thought to be a hemorrhoid. He was given hemorrhoid creams and suppositories. Over the next few days the nodule burst and purulent drainage occurred. Since that time he has had consistent drainage every few days. He has occasional pain. He has regular bowel movements and denies any chronic diarrhea. He has no other medical problems.   Past Surgical History Kristen Cardinal, CMA; 03/06/2018 9:31 AM) No pertinent past surgical history  Diagnostic Studies History Kristen Cardinal, CMA; 03/06/2018 9:31 AM) Colonoscopy never  Allergies Martie Lee Canty, CMA; 03/06/2018 9:32 AM) No Known Drug Allergies [03/06/2018]: Allergies Reconciled  Medication History Kristen Cardinal, CMA; 03/06/2018 9:32 AM) No Current Medications Medications Reconciled  Social History Kristen Cardinal, CMA; 03/06/2018 9:31 AM) No drug use Tobacco use Never smoker.  Family History Kristen Cardinal, [place]; 03/06/2018 9:31 AM) First Degree Relatives No pertinent family history  Other Problems Kristen Cardinal, CMA; 03/06/2018 9:31 AM) No pertinent past medical history     Review of Systems Martie Lee Canty CMA; 03/06/2018 9:31 AM) General Not Present- Appetite Loss, Chills, Fatigue, Fever, Night Sweats, Weight Gain and Weight Loss. HEENT Not Present- Earache, Hearing Loss, Hoarseness, Nose Bleed, Oral Ulcers, Ringing in the Ears, Seasonal Allergies, Sinus Pain, Sore Throat, Visual Disturbances, Wears glasses/contact lenses and Yellow Eyes. Respiratory Not Present- Bloody sputum, Chronic Cough, Difficulty Breathing, Snoring and Wheezing. Breast Not Present- Breast Mass, Breast Pain,  Nipple Discharge and Skin Changes. Cardiovascular Not Present- Chest Pain, Difficulty Breathing Lying Down, Leg Cramps, Palpitations, Rapid Heart Rate, Shortness of Breath and Swelling of Extremities. Gastrointestinal Not Present- Abdominal Pain, Bloating, Bloody Stool, Change in Bowel Habits, Chronic diarrhea, Constipation, Difficulty Swallowing, Excessive gas, Gets full quickly at meals, Hemorrhoids, Indigestion, Nausea, Rectal Pain and Vomiting. Male Genitourinary Not Present- Blood in Urine, Change in Urinary Stream, Frequency, Impotence, Nocturia, Painful Urination, Urgency and Urine Leakage. Endocrine Not Present- Cold Intolerance, Excessive Hunger, Hair Changes, Heat Intolerance, Hot flashes and New Diabetes. Hematology Not Present- Blood Thinners, Easy Bruising, Excessive bleeding, Gland problems, HIV and Persistent Infections.  Vitals (Sabrina Canty CMA; 03/06/2018 9:33 AM) 03/06/2018 9:32 AM Weight: 202 lb Height: 60in Body Surface Area: 1.87 m Body Mass Index: 39.45 kg/m  Temp.: 98.82F  Pulse: 78 (Regular)  P.OX: 99% (Room air) BP: 130/78 (Sitting, Left Arm, Standard)      Physical Exam Romie Levee MD; 03/06/2018 9:55 AM)  General Mental Status-Alert. General Appearance-Not in acute distress. Build & Nutrition-Well nourished. Posture-Normal posture. Gait-Normal.  Head and Neck Head-normocephalic, atraumatic with no lesions or palpable masses. Trachea-midline.  Chest and Lung Exam Chest and lung exam reveals -on auscultation, normal breath sounds, no adventitious sounds and normal vocal resonance.  Cardiovascular Cardiovascular examination reveals -normal heart sounds, regular rate and rhythm with no murmurs and no digital clubbing, cyanosis, edema, increased warmth or tenderness.  Abdomen Inspection Inspection of the abdomen reveals - No Hernias. Palpation/Percussion Palpation and Percussion of the abdomen reveal - Soft, Non  Tender, No Rigidity (guarding), No hepatosplenomegaly and No Palpable abdominal masses.  Rectal Anorectal Exam External - Note: left anterior external opening c/w fistula.  Neurologic Neurologic evaluation reveals -alert and oriented x 3 with no impairment of recent  or remote memory, normal attention span and ability to concentrate, normal sensation and normal coordination.  Musculoskeletal Normal Exam - Bilateral-Upper Extremity Strength Normal and Lower Extremity Strength Normal.    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 We discussed typical surgical correction for this which includes fistulotomy as long as there is minimal sphincter involvement. We discussed the risk of incontinence if too much sphincter is incised. We discussed that this decision will be made intraoperatively. We discussed the typical postoperative recovery times and the need for seton placement if too much muscle involvement is assessed. We discussed the need for additional surgery if seton is placed. All questions were answered.

## 2018-03-06 NOTE — H&P (Signed)
History of Present Illness Romie Levee MD; 03/06/2018 9:51 AM) The patient is a 32 year old male who presents with a complaint of anal problems. 32 year old male who presents to the office with complaints of a perianal nodule which has been present since September of this year. He reports that he went to the ER for this and it was thought to be a hemorrhoid. He was given hemorrhoid creams and suppositories. Over the next few days the nodule burst and purulent drainage occurred. Since that time he has had consistent drainage every few days. He has occasional pain. He has regular bowel movements and denies any chronic diarrhea. He has no other medical problems.   Past Surgical History Kristen Cardinal, CMA; 03/06/2018 9:31 AM) No pertinent past surgical history  Diagnostic Studies History Kristen Cardinal, CMA; 03/06/2018 9:31 AM) Colonoscopy never  Allergies Martie Lee Canty, CMA; 03/06/2018 9:32 AM) No Known Drug Allergies [03/06/2018]: Allergies Reconciled  Medication History Kristen Cardinal, CMA; 03/06/2018 9:32 AM) No Current Medications Medications Reconciled  Social History Kristen Cardinal, CMA; 03/06/2018 9:31 AM) No drug use Tobacco use Never smoker.  Family History Kristen Cardinal, [place]; 03/06/2018 9:31 AM) First Degree Relatives No pertinent family history  Other Problems Kristen Cardinal, CMA; 03/06/2018 9:31 AM) No pertinent past medical history     Review of Systems Martie Lee Canty CMA; 03/06/2018 9:31 AM) General Not Present- Appetite Loss, Chills, Fatigue, Fever, Night Sweats, Weight Gain and Weight Loss. HEENT Not Present- Earache, Hearing Loss, Hoarseness, Nose Bleed, Oral Ulcers, Ringing in the Ears, Seasonal Allergies, Sinus Pain, Sore Throat, Visual Disturbances, Wears glasses/contact lenses and Yellow Eyes. Respiratory Not Present- Bloody sputum, Chronic Cough, Difficulty Breathing, Snoring and Wheezing. Breast Not Present- Breast Mass, Breast Pain,  Nipple Discharge and Skin Changes. Cardiovascular Not Present- Chest Pain, Difficulty Breathing Lying Down, Leg Cramps, Palpitations, Rapid Heart Rate, Shortness of Breath and Swelling of Extremities. Gastrointestinal Not Present- Abdominal Pain, Bloating, Bloody Stool, Change in Bowel Habits, Chronic diarrhea, Constipation, Difficulty Swallowing, Excessive gas, Gets full quickly at meals, Hemorrhoids, Indigestion, Nausea, Rectal Pain and Vomiting. Male Genitourinary Not Present- Blood in Urine, Change in Urinary Stream, Frequency, Impotence, Nocturia, Painful Urination, Urgency and Urine Leakage. Endocrine Not Present- Cold Intolerance, Excessive Hunger, Hair Changes, Heat Intolerance, Hot flashes and New Diabetes. Hematology Not Present- Blood Thinners, Easy Bruising, Excessive bleeding, Gland problems, HIV and Persistent Infections.  Vitals (Sabrina Canty CMA; 03/06/2018 9:33 AM) 03/06/2018 9:32 AM Weight: 202 lb Height: 60in Body Surface Area: 1.87 m Body Mass Index: 39.45 kg/m  Temp.: 98.22F  Pulse: 78 (Regular)  P.OX: 99% (Room air) BP: 130/78 (Sitting, Left Arm, Standard)      Physical Exam Romie Levee MD; 03/06/2018 9:55 AM)  General Mental Status-Alert. General Appearance-Not in acute distress. Build & Nutrition-Well nourished. Posture-Normal posture. Gait-Normal.  Head and Neck Head-normocephalic, atraumatic with no lesions or palpable masses. Trachea-midline.  Chest and Lung Exam Chest and lung exam reveals -on auscultation, normal breath sounds, no adventitious sounds and normal vocal resonance.  Cardiovascular Cardiovascular examination reveals -normal heart sounds, regular rate and rhythm with no murmurs and no digital clubbing, cyanosis, edema, increased warmth or tenderness.  Abdomen Inspection Inspection of the abdomen reveals - No Hernias. Palpation/Percussion Palpation and Percussion of the abdomen reveal - Soft, Non  Tender, No Rigidity (guarding), No hepatosplenomegaly and No Palpable abdominal masses.  Rectal Anorectal Exam External - Note: left anterior external opening c/w fistula.  Neurologic Neurologic evaluation reveals -alert and oriented x 3 with no impairment of recent  or remote memory, normal attention span and ability to concentrate, normal sensation and normal coordination.  Musculoskeletal Normal Exam - Bilateral-Upper Extremity Strength Normal and Lower Extremity Strength Normal.    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 We discussed typical surgical correction for this which includes fistulotomy as long as there is minimal sphincter involvement. We discussed the risk of incontinence if too much sphincter is incised. We discussed that this decision will be made intraoperatively. We discussed the typical postoperative recovery times and the need for seton placement if too much muscle involvement is assessed. We discussed the need for additional surgery if seton is placed. All questions were answered.

## 2018-03-16 ENCOUNTER — Encounter (HOSPITAL_BASED_OUTPATIENT_CLINIC_OR_DEPARTMENT_OTHER): Payer: Self-pay

## 2018-03-16 ENCOUNTER — Other Ambulatory Visit: Payer: Self-pay

## 2018-03-16 NOTE — Progress Notes (Signed)
Spoke with:  [name] NPO:  After Midnight, no gum, candy, or mints   Arrival time: 0830AM Labs: N/A AM medications: None Pre op orders:  Yes Ride home:  Edward DieterRosa (wife) [phone]

## 2018-03-30 ENCOUNTER — Encounter (HOSPITAL_BASED_OUTPATIENT_CLINIC_OR_DEPARTMENT_OTHER): Payer: Self-pay | Admitting: *Deleted

## 2018-03-30 ENCOUNTER — Other Ambulatory Visit: Payer: Self-pay

## 2018-03-30 ENCOUNTER — Ambulatory Visit (HOSPITAL_BASED_OUTPATIENT_CLINIC_OR_DEPARTMENT_OTHER): Payer: Self-pay | Admitting: Anesthesiology

## 2018-03-30 ENCOUNTER — Ambulatory Visit (HOSPITAL_BASED_OUTPATIENT_CLINIC_OR_DEPARTMENT_OTHER)
Admission: RE | Admit: 2018-03-30 | Discharge: 2018-03-30 | Disposition: A | Discharge status: Home / Self Care | Payer: Self-pay | Attending: General Surgery | Admitting: General Surgery

## 2018-03-30 ENCOUNTER — Encounter (HOSPITAL_BASED_OUTPATIENT_CLINIC_OR_DEPARTMENT_OTHER)
Admission: RE | Disposition: A | Discharge status: Home / Self Care | Payer: Self-pay | Source: Home / Self Care | Attending: General Surgery

## 2018-03-30 DIAGNOSIS — K603 Anal fistula: Secondary | ICD-10-CM | POA: Insufficient documentation

## 2018-03-30 HISTORY — DX: Anal fistula: K60.3

## 2018-03-30 HISTORY — PX: ANAL FISTULOTOMY: SHX6423

## 2018-03-30 HISTORY — PX: RECTAL EXAM UNDER ANESTHESIA: SHX6399

## 2018-03-30 SURGERY — EXAM UNDER ANESTHESIA, RECTUM
Anesthesia: Monitor Anesthesia Care | Site: Rectum

## 2018-03-30 MED ORDER — FENTANYL CITRATE (PF) 100 MCG/2ML IJ SOLN
INTRAMUSCULAR | Status: AC
  Filled 2018-03-30: qty 2

## 2018-03-30 MED ORDER — GABAPENTIN 300 MG PO CAPS
ORAL_CAPSULE | ORAL | Status: AC
  Filled 2018-03-30: qty 1

## 2018-03-30 MED ORDER — OXYCODONE-ACETAMINOPHEN 5-325 MG PO TABS: 1.0000 | ORAL_TABLET | Freq: Four times a day (QID) | ORAL | 0 refills | Status: DC | PRN

## 2018-03-30 MED ORDER — ACETAMINOPHEN 650 MG RE SUPP
650.0000 mg | RECTAL | Status: DC | PRN
  Filled 2018-03-30: qty 1

## 2018-03-30 MED ORDER — MIDAZOLAM HCL 2 MG/2ML IJ SOLN
INTRAMUSCULAR | Status: AC
  Filled 2018-03-30: qty 4

## 2018-03-30 MED ORDER — ACETAMINOPHEN 500 MG PO TABS
ORAL_TABLET | ORAL | Status: AC
  Filled 2018-03-30: qty 2

## 2018-03-30 MED ORDER — PROPOFOL 10 MG/ML IV BOLUS
INTRAVENOUS | Status: DC | PRN
  Administered 2018-03-30: 50 mg via INTRAVENOUS
  Administered 2018-03-30: 40 mg via INTRAVENOUS

## 2018-03-30 MED ORDER — CELECOXIB 200 MG PO CAPS
ORAL_CAPSULE | ORAL | Status: AC
  Filled 2018-03-30: qty 1

## 2018-03-30 MED ORDER — ACETAMINOPHEN 500 MG PO TABS
1000.0000 mg | ORAL_TABLET | ORAL | Status: AC
  Administered 2018-03-30: 1000 mg via ORAL
  Filled 2018-03-30: qty 2

## 2018-03-30 MED ORDER — CELECOXIB 200 MG PO CAPS
200.0000 mg | ORAL_CAPSULE | ORAL | Status: AC
  Administered 2018-03-30: 200 mg via ORAL
  Filled 2018-03-30: qty 1

## 2018-03-30 MED ORDER — SODIUM CHLORIDE 0.9 % IV SOLN
250.0000 mL | INTRAVENOUS | Status: DC | PRN
  Filled 2018-03-30: qty 250

## 2018-03-30 MED ORDER — BUPIVACAINE HCL (PF) 0.5 % IJ SOLN
INTRAMUSCULAR | Status: DC | PRN
  Administered 2018-03-30: 30 mL

## 2018-03-30 MED ORDER — EPINEPHRINE PF 1 MG/ML IJ SOLN
INTRAMUSCULAR | Status: DC | PRN
  Administered 2018-03-30: 1 mg

## 2018-03-30 MED ORDER — FENTANYL CITRATE (PF) 100 MCG/2ML IJ SOLN
INTRAMUSCULAR | Status: DC | PRN
  Administered 2018-03-30: 50 ug via INTRAVENOUS

## 2018-03-30 MED ORDER — PROPOFOL 10 MG/ML IV BOLUS
INTRAVENOUS | Status: AC
  Filled 2018-03-30: qty 20

## 2018-03-30 MED ORDER — PROPOFOL 500 MG/50ML IV EMUL
INTRAVENOUS | Status: DC | PRN
  Administered 2018-03-30: 150 ug/kg/min via INTRAVENOUS

## 2018-03-30 MED ORDER — OXYCODONE HCL 5 MG PO TABS
5.0000 mg | ORAL_TABLET | ORAL | Status: DC | PRN
  Filled 2018-03-30: qty 2

## 2018-03-30 MED ORDER — LIDOCAINE 2% (20 MG/ML) 5 ML SYRINGE
INTRAMUSCULAR | Status: DC | PRN
  Administered 2018-03-30: 100 mg via INTRAVENOUS

## 2018-03-30 MED ORDER — ACETAMINOPHEN 325 MG PO TABS
650.0000 mg | ORAL_TABLET | ORAL | Status: DC | PRN
  Filled 2018-03-30: qty 2

## 2018-03-30 MED ORDER — OXYCODONE HCL 5 MG PO TABS
5.0000 mg | ORAL_TABLET | Freq: Once | ORAL | Status: DC | PRN
  Filled 2018-03-30: qty 1

## 2018-03-30 MED ORDER — LACTATED RINGERS IV SOLN
INTRAVENOUS | Status: DC
  Filled 2018-03-30: qty 1000

## 2018-03-30 MED ORDER — LIDOCAINE 5 % EX OINT
TOPICAL_OINTMENT | CUTANEOUS | Status: DC | PRN
  Administered 2018-03-30: 1

## 2018-03-30 MED ORDER — MIDAZOLAM HCL 5 MG/5ML IJ SOLN
INTRAMUSCULAR | Status: DC | PRN
  Administered 2018-03-30 (×2): 2 mg via INTRAVENOUS

## 2018-03-30 MED ORDER — OXYCODONE HCL 5 MG/5ML PO SOLN
5.0000 mg | Freq: Once | ORAL | Status: DC | PRN
  Filled 2018-03-30: qty 5

## 2018-03-30 MED ORDER — PROMETHAZINE HCL 25 MG/ML IJ SOLN
6.2500 mg | INTRAMUSCULAR | Status: DC | PRN
  Filled 2018-03-30: qty 1

## 2018-03-30 MED ORDER — LIDOCAINE 2% (20 MG/ML) 5 ML SYRINGE
INTRAMUSCULAR | Status: AC
  Filled 2018-03-30: qty 5

## 2018-03-30 MED ORDER — HYDROMORPHONE HCL 1 MG/ML IJ SOLN
0.2500 mg | INTRAMUSCULAR | Status: DC | PRN
  Filled 2018-03-30: qty 0.5

## 2018-03-30 MED ORDER — GABAPENTIN 300 MG PO CAPS
300.0000 mg | ORAL_CAPSULE | ORAL | Status: AC
  Administered 2018-03-30: 300 mg via ORAL
  Filled 2018-03-30: qty 1

## 2018-03-30 MED ORDER — SODIUM CHLORIDE 0.9% FLUSH
3.0000 mL | Freq: Two times a day (BID) | INTRAVENOUS | Status: DC
  Filled 2018-03-30: qty 3

## 2018-03-30 MED ORDER — SODIUM CHLORIDE 0.9% FLUSH
3.0000 mL | INTRAVENOUS | Status: DC | PRN
  Filled 2018-03-30: qty 3

## 2018-03-30 SURGICAL SUPPLY — 51 items
BENZOIN TINCTURE PRP APPL 2/3 (GAUZE/BANDAGES/DRESSINGS) ×4 IMPLANT
BLADE EXTENDED COATED 6.5IN (ELECTRODE) IMPLANT
BLADE HEX COATED 2.75 (ELECTRODE) ×3 IMPLANT
BLADE SURG 10 STRL SS (BLADE) IMPLANT
BRIEF STRETCH FOR OB PAD LRG (UNDERPADS AND DIAPERS) ×6 IMPLANT
CANISTER SUCT 3000ML PPV (MISCELLANEOUS) ×3 IMPLANT
COVER BACK TABLE 60X90IN (DRAPES) ×3 IMPLANT
COVER MAYO STAND STRL (DRAPES) ×3 IMPLANT
COVER WAND RF STERILE (DRAPES) ×6 IMPLANT
DECANTER SPIKE VIAL GLASS SM (MISCELLANEOUS) ×3 IMPLANT
DRAPE LAPAROTOMY 100X72 PEDS (DRAPES) ×3 IMPLANT
DRAPE UTILITY XL STRL (DRAPES) ×3 IMPLANT
ELECT REM PT RETURN 9FT ADLT (ELECTROSURGICAL) ×3
ELECTRODE REM PT RTRN 9FT ADLT (ELECTROSURGICAL) ×1 IMPLANT
GAUZE SPONGE 4X4 12PLY STRL (GAUZE/BANDAGES/DRESSINGS) ×3 IMPLANT
GLOVE BIO SURGEON STRL SZ 6.5 (GLOVE) ×2 IMPLANT
GLOVE BIO SURGEONS STRL SZ 6.5 (GLOVE) ×1
GLOVE BIOGEL PI IND STRL 7.0 (GLOVE) ×1 IMPLANT
GLOVE BIOGEL PI INDICATOR 7.0 (GLOVE) ×2
GOWN SPEC L3 XXLG W/TWL (GOWN DISPOSABLE) ×3 IMPLANT
HYDROGEN PEROXIDE 16OZ (MISCELLANEOUS) ×3 IMPLANT
IV CATH 14GX2 1/4 (CATHETERS) ×3 IMPLANT
IV CATH 18G SAFETY (IV SOLUTION) ×3 IMPLANT
KIT TURNOVER CYSTO (KITS) ×3 IMPLANT
LOOP VESSEL MAXI BLUE (MISCELLANEOUS) IMPLANT
NEEDLE HYPO 22GX1.5 SAFETY (NEEDLE) ×3 IMPLANT
NS IRRIG 500ML POUR BTL (IV SOLUTION) ×3 IMPLANT
PACK BASIN DAY SURGERY FS (CUSTOM PROCEDURE TRAY) ×3 IMPLANT
PAD ABD 8X10 STRL (GAUZE/BANDAGES/DRESSINGS) ×3 IMPLANT
PAD ARMBOARD 7.5X6 YLW CONV (MISCELLANEOUS) IMPLANT
PENCIL BUTTON HOLSTER BLD 10FT (ELECTRODE) ×3 IMPLANT
SPONGE HEMORRHOID 8X3CM (HEMOSTASIS) IMPLANT
SPONGE SURGIFOAM ABS GEL 12-7 (HEMOSTASIS) IMPLANT
SUCTION FRAZIER HANDLE 10FR (MISCELLANEOUS)
SUCTION TUBE FRAZIER 10FR DISP (MISCELLANEOUS) IMPLANT
SUT CHROMIC 2 0 SH (SUTURE) IMPLANT
SUT CHROMIC 3 0 SH 27 (SUTURE) ×2 IMPLANT
SUT ETHIBOND 0 (SUTURE) IMPLANT
SUT VIC AB 2-0 SH 27 (SUTURE)
SUT VIC AB 2-0 SH 27XBRD (SUTURE) IMPLANT
SUT VIC AB 3-0 SH 18 (SUTURE) IMPLANT
SUT VIC AB 3-0 SH 27 (SUTURE)
SUT VIC AB 3-0 SH 27XBRD (SUTURE) IMPLANT
SUT VIC AB 4-0 P-3 18XBRD (SUTURE) IMPLANT
SUT VIC AB 4-0 P3 18 (SUTURE)
SYR CONTROL 10ML LL (SYRINGE) ×3 IMPLANT
TOWEL OR 17X24 6PK STRL BLUE (TOWEL DISPOSABLE) ×3 IMPLANT
TRAY DSU PREP LF (CUSTOM PROCEDURE TRAY) ×3 IMPLANT
TUBE CONNECTING 12'X1/4 (SUCTIONS) ×1
TUBE CONNECTING 12X1/4 (SUCTIONS) ×2 IMPLANT
YANKAUER SUCT BULB TIP NO VENT (SUCTIONS) ×3 IMPLANT

## 2018-03-30 NOTE — Anesthesia Procedure Notes (Signed)
Procedure Name: MAC Date/Time: 03/30/2018 10:15 AM Performed by: Bonney Aid, CRNA Pre-anesthesia Checklist: Patient identified, Emergency Drugs available, Suction available, Patient being monitored and Timeout performed Patient Re-evaluated:Patient Re-evaluated prior to induction Oxygen Delivery Method: Nasal cannula and Circle system utilized

## 2018-03-30 NOTE — Anesthesia Postprocedure Evaluation (Signed)
Anesthesia Post Note  Patient: Edward Lewis  Procedure(s) Performed: ANAL EXAM UNDER ANESTHESIA (N/A Rectum) FISTULOTOMY (N/A Rectum)     Patient location during evaluation: PACU Anesthesia Type: MAC Level of consciousness: awake and alert Pain management: pain level controlled Vital Signs Assessment: post-procedure vital signs reviewed and stable Respiratory status: spontaneous breathing, nonlabored ventilation, respiratory function stable and patient connected to nasal cannula oxygen Cardiovascular status: stable and blood pressure returned to baseline Postop Assessment: no apparent nausea or vomiting Anesthetic complications: no    Last Vitals:  Vitals:   03/30/18 1115 03/30/18 1220  BP: 109/90 117/77  Pulse: 97 94  Resp: 18 14  Temp: 36.7 C 36.7 C  SpO2: 100% 100%    Last Pain:  Vitals:   03/30/18 1220  TempSrc:   PainSc: 0-No pain                 Ryan P Ellender

## 2018-03-30 NOTE — Interval H&P Note (Signed)
History and Physical Interval Note:  03/30/2018 8:53 AM  Edward Lewis  has presented today for surgery, with the diagnosis of Anal fistula  The various methods of treatment have been discussed with the patient and family. After consideration of risks, benefits and other options for treatment, the patient has consented to  Procedure(s): ANAL EXAM UNDER ANESTHESIA (N/A) FISTULOTOMY VS SETON (N/A) as a surgical intervention .  The patient's history has been reviewed, patient examined, no change in status, stable for surgery.  I have reviewed the patient's chart and labs.  Questions were answered to the patient's satisfaction.     Vanita PandaAlicia C Everlee Quakenbush, MD  Colorectal and General Surgery [facility]

## 2018-03-30 NOTE — Transfer of Care (Signed)
Immediate Anesthesia Transfer of Care Note  Patient: Edward Lewis  Procedure(s) Performed: ANAL EXAM UNDER ANESTHESIA (N/A Rectum) FISTULOTOMY (N/A Rectum)  Patient Location: PACU  Anesthesia Type:MAC  Level of Consciousness: drowsy  Airway & Oxygen Therapy: Patient Spontanous Breathing and Patient connected to nasal cannula oxygen  Post-op Assessment: Report given to RN  Post vital signs: Reviewed and stable  Last Vitals: 114/66, 125, 16, 100, 98.5 Vitals Value Taken Time  BP    Temp    Pulse    Resp    SpO2      Last Pain:  Vitals:   03/30/18 0932  TempSrc:   PainSc: 1       Patients Stated Pain Goal: 5 (85/88/50 2774)  Complications: No apparent anesthesia complications

## 2018-03-30 NOTE — Discharge Instructions (Addendum)
ANORECTAL SURGERY: POST OP INSTRUCTIONS °1. Take your usually prescribed home medications unless otherwise directed. °2. DIET: During the first few hours after surgery sip on some liquids until you are able to urinate.  It is normal to not urinate for several hours after this surgery.  If you feel uncomfortable, please contact the office for instructions.  After you are able to urinate,you may eat, if you feel like it.  Follow a light bland diet the first 24 hours after arrival home, such as soup, liquids, crackers, etc.  Be sure to include lots of fluids daily (6-8 glasses).  Avoid fast food or heavy meals, as your are more likely to get nauseated.  Eat a low fat diet the next few days after surgery.  Limit caffeine intake to 1-2 servings a day. °3. PAIN CONTROL: °a. Pain is best controlled by a usual combination of several different methods TOGETHER: °i. Muscle relaxation: Soak in a warm bath (or Sitz bath) three times a day and after bowel movements.  Continue to do this until all pain is resolved.  °ii. Over the counter pain medication °iii. Prescription pain medication °b. Most patients will experience some swelling and discomfort in the anus/rectal area and incisions.  Heat such as warm towels, sitz baths, warm baths, etc to help relax tight/sore spots and speed recovery.  Some people prefer to use ice, especially in the first couple days after surgery, as it may decrease the pain and swelling, or alternate between ice & heat.  Experiment to what works for you.  Swelling and bruising can take several weeks to resolve.  Pain can take even longer to completely resolve. °c. It is helpful to take an over-the-counter pain medication regularly for the first few weeks.  Choose one of the following that works best for you: °i. Naproxen (Aleve, etc)  Two 220mg tabs twice a day °ii. Ibuprofen (Advil, etc) Three 200mg tabs four times a day (every meal & bedtime) °d. A  prescription for pain medication (such as percocet,  oxycodone, hydrocodone, etc) should be given to you upon discharge.  Take your pain medication as prescribed.  °i. If you are having problems/concerns with the prescription medicine (does not control pain, nausea, vomiting, rash, itching, etc), please call us [phone] to see if we need to switch you to a different pain medicine that will work better for you and/or control your side effect better. °ii. If you need a refill on your pain medication, please contact your pharmacy.  They will contact our office to request authorization. Prescriptions will not be filled after 5 pm or on week-ends. °4. KEEP YOUR BOWELS REGULAR and AVOID CONSTIPATION °a. The goal is one to two soft bowel movements a day.  You should at least have a bowel movement every other day. °b. Avoid getting constipated.  Between the surgery and the pain medications, it is common to experience some constipation. This can be very painful after rectal surgery.  Increasing fluid intake and taking a fiber supplement (such as Metamucil, Citrucel, FiberCon, etc) 1-2 times a day regularly will usually help prevent this problem from occurring.  A stool softener like colace is also recommended.  This can be purchased over the counter at your pharmacy.  You can take it up to 3 times a day.  If you do not have a bowel movement after 24 hrs since your surgery, take one does of milk of magnesia.  If you still haven't had a bowel movement 8-12 hours   after that dose, take another dose.  If you don't have a bowel movement 48 hrs after surgery, purchase a Fleets enema from the drug store and administer gently per package instructions.  If you still are having trouble with your bowel movements after that, please call the office for further instructions. °c. If you develop diarrhea or have many loose bowel movements, simplify your diet to bland foods & liquids for a few days.  Stop any stool softeners and decrease your fiber supplement.  Switching to mild  anti-diarrheal medications (Kayopectate, Pepto Bismol) can help.  If this worsens or does not improve, please call us. ° °5. Wound Care °a. Remove your bandages before your first bowel movement or 8 hours after surgery.     °b. Remove any wound packing material at this tim,e as well.  You do not need to repack the wound unless instructed otherwise.  Wear an absorbent pad or soft cotton gauze in your underwear to catch any drainage and help keep the area clean. You should change this every 2-3 hours while awake. °c. Keep the area clean and dry.  Bathe / shower every day, especially after bowel movements.  Keep the area clean by showering / bathing over the incision / wound.   It is okay to soak an open wound to help wash it.  Wet wipes or showers / gentle washing after bowel movements is often less traumatic than regular toilet paper. °d. You may have some styrofoam-like soft packing in the rectum which will come out with the first bowel movement.  °e. You will often notice bleeding with bowel movements.  This should slow down by the end of the first week of surgery °f. Expect some drainage.  This should slow down, too, by the end of the first week of surgery.  Wear an absorbent pad or soft cotton gauze in your underwear until the drainage stops. °g. Do Not sit on a rubber or pillow ring.  This can make you symptoms worse.  You may sit on a soft pillow if needed.  °6. ACTIVITIES as tolerated:   °a. You may resume regular (light) daily activities beginning the next day--such as daily self-care, walking, climbing stairs--gradually increasing activities as tolerated.  If you can walk 30 minutes without difficulty, it is safe to try more intense activity such as jogging, treadmill, bicycling, low-impact aerobics, swimming, etc. °b. Save the most intensive and strenuous activity for last such as sit-ups, heavy lifting, contact sports, etc  Refrain from any heavy lifting or straining until you are off narcotics for pain  control.   °c. You may drive when you are no longer taking prescription pain medication, you can comfortably sit for long periods of time, and you can safely maneuver your car and apply brakes. °d. You may have sexual intercourse when it is comfortable.  °7. FOLLOW UP in our office °a. Please call [facility] at [phone] to set up an appointment to see your surgeon in the office for a follow-up appointment approximately 3-4 weeks after your surgery. °b. Make sure that you call for this appointment the day you arrive home to insure a convenient appointment time. °10. IF YOU HAVE DISABILITY OR FAMILY LEAVE FORMS, BRING THEM TO THE OFFICE FOR PROCESSING.  DO NOT GIVE THEM TO YOUR DOCTOR. ° ° ° ° °WHEN TO CALL US [phone]: °1. Poor pain control °2. Reactions / problems with new medications (rash/itching, nausea, etc)  °3. Fever over 101.5 F (38.5 C) °  4. Inability to urinate 5. Nausea and/or vomiting 6. Worsening swelling or bruising 7. Continued bleeding from incision. 8. Increased pain, redness, or drainage from the incision  The clinic staff is available to answer your questions during regular business hours (8:30am-5pm).  Please dont hesitate to call and ask to speak to one of our nurses for clinical concerns.   [facility] from [facility] is always on call at the hospitals   If you have a medical emergency, go to the nearest emergency room or call 911.    [facility] [address], [address], [place], [place]  [id number] ? MAIN: [phone] ? TOLL FREE: [phone] ? FAX [phone] [email or web address]      Anestesia general, en adultos General Anesthesia, Adult La anestesia general es el uso de medicamentos para dormir a Physiological scientistuna persona (dejarla inconsciente) para un procedimiento mdico. La anestesia general se debe usar para ciertos procedimientos y normalmente se recomienda para procedimientos que:  Teaching laboratory technicianDuran mucho tiempo.  Requieren  que se quede quieto o que est en una posicin inusual.  Son importantes y pueden producir prdida de Retail buyersangre. Los medicamentos utilizados para la anestesia general se llaman anestsicos generales. Adems de dejarlo inconsciente durante una cierta cantidad de Hicotiempo, estos medicamentos:  Geneticist, molecularvitan el dolor.  Controlan su presin arterial.  Relajan los msculos. Informe al mdico acerca de lo siguiente:  Cualquier alergia que tenga.  Todos los Walt Disneymedicamentos que utiliza, incluidos vitaminas, hierbas, gotas oftlmicas, cremas y [address]icamentos de [address] libre.  Cualquier problema previo que usted o algn miembro de su familia haya tenido con los anestsicos.  Tipos de anestsicos que le hayan administrado en el pasado.  Cualquier trastorno de la sangre que tenga.  Cirugas a las que se haya sometido.  Cualquier afeccin mdica que tenga.  Infecciones recientes en las vas respiratorias altas, el pecho o el odo.  Antecedentes de: ? Afecciones cardacas o pulmonares, como insuficiencia cardaca, apnea del sueo, asma o enfermedad pulmonar obstructiva crnica (EPOC). ? Paramedicervicio militar. ? Depresin o ansiedad.  Cualquier consumo de tabaco o drogas ilegales, lo que incluye consumo de marihuana o alcohol.  Si est embarazada o podra estarlo. Cules son los riesgos? En general, se trata de un procedimiento seguro. Sin embargo, pueden ocurrir complicaciones, por ejemplo:  Automotive engineereaccin alrgica.  Problemas cardacos o pulmonares.  Inhalacin de alimentos o lquidos del estmago a los pulmones (aspiracin).  Lesiones en los nervios.  Lesin ONEOKen los dientes.  Aire en el torrente sanguneo, que puede provocar un accidente cerebrovascular.  Nerviosismo o confusin extremos (delirio) al despertarse de la anestesia.  Despertarse durante su procedimiento y no poder moverse. Esto es poco frecuente. Es ms probable que Set designersurjan estos problemas en caso de que le realicen una ciruga mayor o si  tiene una afeccin mdica avanzada o grave. Puede evitar algunas de estas complicaciones respondiendo todas las preguntas del mdico meticulosamente y siguiendo todas las indicaciones que le brinden antes del procedimiento. La anestesia general puede causar efectos secundarios, como por ejemplo:  Nuseas o vmitos.  Dolor de garganta causado por el tubo respiratorio.  Ronquera.  Tos o sibilancias.  Escalofros.  Cansancio.  Dolores PepsiCoen el cuerpo.  Ansiedad.  Sueo o somnolencia.  Confusin o agitacin. Qu ocurre antes del procedimiento? Cmo mantenerse hidratado Siga las indicaciones del mdico acerca de mantenerse hidratado, las cuales pueden incluir lo siguiente:  CIT GroupHasta dos horas antes del procedimiento, puede beber lquidos claros, como agua, jugos de fruta sin pulpa, caf  negro y t solo.  Restricciones en las comidas y bebidas Siga las instrucciones del mdico respecto de las restricciones de comidas o bebidas, las cuales pueden incluir lo siguiente:  Ocho horas antes del procedimiento, deje de ingerir comidas o alimentos pesados, por ejemplo, carne, alimentos fritos o alimentos grasos.  Seis horas antes del procedimiento, deje de ingerir comidas o alimentos livianos, como tostadas o cereales.  Seis horas antes del procedimiento, deje de tomar [address] o bebidas que [employer].  Dos horas antes del procedimiento, deje de beber lquidos claros. Medicamentos Consulte al mdico sobre:  Multimedia programmer o suspender los medicamentos que toma habitualmente. Esto es muy importante si toma medicamentos para la diabetes o anticoagulantes.  Tomar medicamentos como aspirina e ibuprofeno. Estos medicamentos pueden tener un efecto anticoagulante en la [place]. No tome estos medicamentos a menos que el mdico se lo indique.  Tomar medicamentos de [employer], vitaminas, hierbas y suplementos. No tome estos medicamentos durante la semana anterior a la ciruga, a menos que el mdico lo  autorice. Indicaciones generales  A partir de 3 a 6 semanas antes del procedimiento, no consuma ningn producto que contenga tabaco o nicotina, como cigarrillos y Administrator, Civil Service. Si necesita ayuda para dejar de fumar, consulte al mdico.  Si se lava los dientes la maana del procedimiento, asegrese de escupir toda la pasta de dientes.  Informe al mdico si se enferma o si se resfra, tiene tos o fiebre.  Si se lo indica el mdico, lleve el dispositivo para la apnea del sueo con usted el da de la ciruga (si corresponde).  Pregntele al mdico si volver a su casa el mismo da o al da siguiente, o si debe quedarse en el hospital por [employer]. ? Haga que alguien lo lleve a su casa desde el hospital o la clnica. ? Haga que un adulto responsable lo cuide durante al menos 24horas despus de que le den el alta del hospital o de la [place]. Esto es importante. Qu ocurre durante el procedimiento?   Se le administrar la anestesia a travs lo siguiente: ? Earline [place] colocada sobre su nariz y boca. ? Una va intravenosa en una de las venas.  Pueden administrarle un medicamento para que se relaje (sedante).  Una vez que est inconsciente, se le insertar un tubo respiratorio en la garganta para ayudarlo a respirar. Este se quitar antes de que despierte.  Un anestesista permanecer con usted durante todo el procedimiento. El mdico: ? Continuar administrndole medicamentos y ajustando la dosis para mantenerlo cmodo y Dispensing optician. ? Controlar la presin arterial, el pulso y los niveles de oxgeno para asegurarse de que la anestesia no le cause ningn problema. Este procedimiento puede variar segn el mdico y el hospital. Ladell Heads ocurre despus del procedimiento?  Le controlarn la presin arterial, la temperatura, la frecuencia cardaca, la frecuencia respiratoria y Air cabin crew de oxgeno en la sangre hasta que desaparezca el efecto de los medicamentos administrados.  Se despertar  en un rea de recuperacin. Puede que se despierte lentamente.  Si est agitado o ansioso, pueden darle medicamentos que lo ayuden a Animator.  Si volver a Sales promotion account executive, el mdico verificar que pueda caminar, beber y Geographical information systems officer.  El mdico tambin tratar Psychologist, sport and exercise o efecto secundario que tenga antes de que se vaya a su casa.  No conduzca durante 24horas si le administraron un sedante. Resumen  La anestesia general se [place] para mantenerlo quieto y Psychologist, sport and exercise durante un procedimiento.  Es  importante que le informe al mdico acerca de sus antecedentes mdicos y cualquier ciruga que haya tenido, as como su experiencia previa con la anestesia.  Siga las instrucciones del mdico acerca de cundo dejar de comer, beber o tomar ciertos medicamentos antes del procedimiento.  Haga que alguien lo lleve a su casa desde el hospital o la clnica. Esta informacin no tiene Theme park manager el consejo del mdico. Asegrese de hacerle al mdico cualquier pregunta que tenga. Document Released: 07/08/2010 Document Revised: 08/31/2017 Document Reviewed: 08/31/2017 Elsevier Interactive Patient Education  [employer].

## 2018-03-30 NOTE — Anesthesia Preprocedure Evaluation (Addendum)
Anesthesia Evaluation  Patient identified by MRN, date of birth, ID band Patient awake    Reviewed: Allergy & Precautions, NPO status , Patient's Chart, lab work & pertinent test results  Airway Mallampati: II  TM Distance: >3 FB Neck ROM: Full    Dental no notable dental hx.    Pulmonary neg pulmonary ROS,    Pulmonary exam normal breath sounds clear to auscultation       Cardiovascular negative cardio ROS Normal cardiovascular exam Rhythm:Regular Rate:Normal     Neuro/Psych negative neurological ROS  negative psych ROS   GI/Hepatic negative GI ROS, Neg liver ROS,   Endo/Other  negative endocrine ROS  Renal/GU negative Renal ROS     Musculoskeletal negative musculoskeletal ROS (+)   Abdominal (+) + obese,   Peds  Hematology negative hematology ROS (+)   Anesthesia Other Findings Anal fistula  Reproductive/Obstetrics                            Anesthesia Physical Anesthesia Plan  ASA: I  Anesthesia Plan: MAC   Post-op Pain Management:    Induction: Intravenous  PONV Risk Score and Plan: 1 and Propofol infusion and Treatment may vary due to age or medical condition  Airway Management Planned: Nasal Cannula  Additional Equipment:   Intra-op Plan:   Post-operative Plan:   Informed Consent: I have reviewed the patients History and Physical, chart, labs and discussed the procedure including the risks, benefits and alternatives for the proposed anesthesia with the patient or authorized representative who has indicated his/her understanding and acceptance.   Dental advisory given  Plan Discussed with: CRNA  Anesthesia Plan Comments:        Anesthesia Quick Evaluation

## 2018-03-30 NOTE — Op Note (Signed)
03/30/2018  10:35 AM  PATIENT:  Edward Lewis  33 y.o. male  Patient Care Team: Patient, No Pcp Per as PCP - General (General Practice)  PRE-OPERATIVE DIAGNOSIS:  Anal fistula  POST-OPERATIVE DIAGNOSIS:  Anal fistula  PROCEDURE:   ANAL EXAM UNDER ANESTHESIA FISTULOTOMY   Surgeon(s): Leighton Ruff, MD  ASSISTANT: none  ANESTHESIA:   local and MAC  SPECIMEN:  No Specimen  DISPOSITION OF SPECIMEN:  N/A  COUNTS:  YES  PLAN OF CARE: Discharge to home after PACU  PATIENT DISPOSITION:  PACU - hemodynamically stable.  INDICATION: 33 y.o. M with anal fistula   OR FINDINGS: fistula involving distal 10% internal sphincter only  DESCRIPTION: the patient was identified in the preoperative holding area and taken to the OR where they were laid on the operating room table.  MAC anesthesia was induced without difficulty. The patient was then positioned in prone jackknife position with buttocks gently taped apart.  The patient was then prepped and draped in usual sterile fashion.  SCDs were noted to be in place prior to the initiation of anesthesia. A surgical timeout was performed indicating the correct patient, procedure, positioning and need for preoperative antibiotics.  A rectal block was performed using Marcaine with epinephrine.    I began with a digital rectal exam.  There were no masses noted.  I then placed a Hill-Ferguson anoscope into the anal canal and evaluated this completely.  There were no abnormal findings.  A fistula probe was then inserted into the external opening.  This exited internally at anterior midline.  There was minimal internal sphincter involvement approximately 10% of the distal portion.  There was no external sphincter involvement.  A fistulotomy was performed.  The edges were marsupialized using a running 3-0 chromic suture.  Hemostasis was good.  The patient was then awakened from anesthesia and sent to the postanesthesia care unit in stable  condition.  All counts were correct per operating room staff.

## 2018-03-31 ENCOUNTER — Encounter (HOSPITAL_BASED_OUTPATIENT_CLINIC_OR_DEPARTMENT_OTHER): Payer: Self-pay | Admitting: General Surgery

## 2018-04-29 ENCOUNTER — Encounter (HOSPITAL_COMMUNITY): Payer: Self-pay | Admitting: Emergency Medicine

## 2018-04-29 ENCOUNTER — Emergency Department (HOSPITAL_COMMUNITY)
Admission: EM | Admit: 2018-04-29 | Discharge: 2018-04-29 | Disposition: A | Discharge status: Home / Self Care | Payer: Self-pay | Attending: Emergency Medicine | Admitting: Emergency Medicine

## 2018-04-29 DIAGNOSIS — G44209 Tension-type headache, unspecified, not intractable: Secondary | ICD-10-CM | POA: Insufficient documentation

## 2018-04-29 DIAGNOSIS — Z79899 Other long term (current) drug therapy: Secondary | ICD-10-CM | POA: Insufficient documentation

## 2018-04-29 MED ORDER — BUTALBITAL-APAP-CAFFEINE 50-325-40 MG PO TABS: 1.0000 | ORAL_TABLET | Freq: Four times a day (QID) | ORAL | 0 refills | Status: AC | PRN

## 2018-04-29 NOTE — ED Provider Notes (Signed)
[facility]-EMERGENCY DEPT Provider Note   CSN: [id number] Arrival date & time: 04/29/18  [id number]     History   Chief Complaint Chief Complaint  Patient presents with  . Headache    HPI Edward Lewis is a 33 y.o. male.  33 year old male who presents with headache for over 2 months.  Describes it as a bitemporal headache with pressure that is better in the morning and worse during the daytime when he is at work.  Notes increased stress at work as well as at home.  Denies any emesis with this.  No neck pain.  No ataxia.  No weakness in his arms or legs.  Has medicated with Tylenol with temporary relief.     Past Medical History:  Diagnosis Date  . Anal fistula     There are no active problems to display for this patient.   Past Surgical History:  Procedure Laterality Date  . ANAL FISTULOTOMY N/A 03/30/2018   Procedure: FISTULOTOMY;  Surgeon: Romie Leveehomas, Alicia, MD;  [facility] [place];  Service: General;  Laterality: N/A;  . LACERATION REPAIR Left 04/13/2017   Left index finger  . RECTAL EXAM UNDER ANESTHESIA N/A 03/30/2018   Procedure: ANAL EXAM UNDER ANESTHESIA;  Surgeon: Romie Leveehomas, Alicia, MD;  [facility] [place];  Service: General;  Laterality: N/A;        Home Medications    Prior to Admission medications   Medication Sig Start Date End Date Taking? Authorizing Provider  acetaminophen (TYLENOL) 500 MG tablet Take 500 mg by mouth every 6 (six) hours as needed for mild pain.    [provider]  doxycycline (VIBRAMYCIN) 100 MG capsule Take 1 capsule (100 mg total) by mouth 2 (two) times daily. 02/02/18   Georgetta HaberBurky, Natalie B, NP  naproxen (NAPROSYN) 500 MG tablet Take 1 tablet (500 mg total) by mouth 2 (two) times daily. Patient not taking: Reported on 03/16/2018 12/06/17   Wieters, Fran LowesHallie C, PA-C  oxyCODONE-acetaminophen (PERCOCET/ROXICET) 5-325 MG tablet Take 1 tablet by mouth every 6 (six) hours as needed  for severe pain. 03/30/18   Romie Leveehomas, Alicia, MD  phenylephrine-shark liver oil-mineral oil-petrolatum (PREPARATION H) 0.25-3-14-71.9 % rectal ointment Place 1 application rectally 2 (two) times daily as needed for hemorrhoids.    [provider]    Family History No family history on file.  Social History Social History   Tobacco Use  . Smoking status: Never Smoker  . Smokeless tobacco: Never Used  Substance Use Topics  . Alcohol use: Yes    Comment: Beer  . Drug use: Never     Allergies   Patient has no known allergies.   Review of Systems Review of Systems  All other systems reviewed and are negative.    Physical Exam Updated Vital Signs BP 116/80 (BP Location: Right Arm)   Pulse 71   Temp 98.5 F (36.9 C) (Oral)   Resp 16   Ht 1.626 m (5\' 4" )   Wt 93 kg   SpO2 98%   BMI 35.19 kg/m   Physical Exam Vitals signs and nursing note reviewed.  Constitutional:      General: He is not in acute distress.    Appearance: Normal appearance. He is well-developed. He is not toxic-appearing.  HENT:     Head: Normocephalic and atraumatic.  Eyes:     General: Lids are normal.     Conjunctiva/sclera: Conjunctivae normal.     Pupils: Pupils are equal, round, and reactive to  light.  Neck:     Musculoskeletal: Normal range of motion and neck supple.     Thyroid: No thyroid mass.     Trachea: No tracheal deviation.  Cardiovascular:     Rate and Rhythm: Normal rate and regular rhythm.     Heart sounds: Normal heart sounds. No murmur. No gallop.   Pulmonary:     Effort: Pulmonary effort is normal. No respiratory distress.     Breath sounds: Normal breath sounds. No stridor. No decreased breath sounds, wheezing, rhonchi or rales.  Abdominal:     General: Bowel sounds are normal. There is no distension.     Palpations: Abdomen is soft.     Tenderness: There is no abdominal tenderness. There is no rebound.  Musculoskeletal: Normal range of motion.        General: No  tenderness.  Skin:    General: Skin is warm and dry.     Findings: No abrasion or rash.  Neurological:     Mental Status: He is alert and oriented to person, place, and time.     GCS: GCS eye subscore is 4. GCS verbal subscore is 5. GCS motor subscore is 6.     Cranial Nerves: No cranial nerve deficit.     Sensory: No sensory deficit.  Psychiatric:        Speech: Speech normal.        Behavior: Behavior normal.      ED Treatments / Results  Labs (all labs ordered are listed, but only abnormal results are displayed) Labs Reviewed - No data to display  EKG None  Radiology No results found.  Procedures Procedures (including critical care time)  Medications Ordered in ED Medications - No data to display   Initial Impression / Assessment and Plan / ED Course  I have reviewed the triage vital signs and the nursing notes.  Pertinent labs & imaging results that were available during my care of the patient were reviewed by me and considered in my medical decision making (see chart for details).     Patient offered language interpreter but he is declined. No focal neurological deficits. Patient with likely tension headache.  Precautions given  Final Clinical Impressions(s) / ED Diagnoses   Final diagnoses:  None    ED Discharge Orders    None       Lorre Nick, MD 04/29/18 1030

## 2018-04-29 NOTE — ED Triage Notes (Signed)
Pt reports headache everyday. Thought after surgery on Jan 9 thought would help with pains but didn't. Pt reports that tylenol doesn't help with pains.

## 2018-10-13 ENCOUNTER — Emergency Department (HOSPITAL_COMMUNITY)
Admission: EM | Admit: 2018-10-13 | Discharge: 2018-10-13 | Disposition: A | Discharge status: Home / Self Care | Payer: Self-pay | Attending: Emergency Medicine | Admitting: Emergency Medicine

## 2018-10-13 ENCOUNTER — Other Ambulatory Visit: Payer: Self-pay

## 2018-10-13 ENCOUNTER — Encounter (HOSPITAL_COMMUNITY): Payer: Self-pay

## 2018-10-13 DIAGNOSIS — Z5321 Procedure and treatment not carried out due to patient leaving prior to being seen by health care provider: Secondary | ICD-10-CM | POA: Insufficient documentation

## 2018-10-13 NOTE — ED Notes (Signed)
Pt  States that he cannot stay as he has somewhere else to be, Pt state " I don't want to stay here 2-3 hours, pt made aware that he was in the fast track, pt state I will come back tomorrow.

## 2018-10-13 NOTE — ED Triage Notes (Signed)
patient c/o intermittent chest pain and mid back pain when he takes a deep breath. Patient states he went to a chiropractic and was told he had a "rib out of place which was causing him pain." Patient states when he went for his first appointment it felt better and when he went to his 2nd appointment the pain was still there.

## 2019-01-15 ENCOUNTER — Encounter (HOSPITAL_COMMUNITY): Payer: Self-pay | Admitting: Emergency Medicine

## 2019-01-15 ENCOUNTER — Emergency Department (HOSPITAL_COMMUNITY)
Admission: EM | Admit: 2019-01-15 | Discharge: 2019-01-15 | Disposition: A | Discharge status: Home / Self Care | Payer: Self-pay | Attending: Emergency Medicine | Admitting: Emergency Medicine

## 2019-01-15 ENCOUNTER — Other Ambulatory Visit: Payer: Self-pay

## 2019-01-15 DIAGNOSIS — M940 Chondrocostal junction syndrome [Tietze]: Secondary | ICD-10-CM | POA: Insufficient documentation

## 2019-01-15 MED ORDER — NAPROXEN 500 MG PO TABS: 500.0000 mg | ORAL_TABLET | Freq: Two times a day (BID) | ORAL | 0 refills | Status: DC

## 2019-01-15 NOTE — ED Provider Notes (Addendum)
[facility] DEPT Provider Note   CSN: [id number] Arrival date & time: 01/15/19  1828     History   Chief Complaint Chief Complaint  Patient presents with  . Rib Pain    HPI Edward Lewis is a 33 y.o. male.     Patient is a 33 year old male with no significant past medical history.  He presents today with complaints of pain to the right anterior chest.  He tells me he has had this for the past 6 months.  The pain is worse when he moves and presses in the area.  He denies any difficulty breathing.  He denies any fevers, chills, or cough.  Patient does work Scientist, research (medical) and performs repetitive motions.  He has been seen by a chiropractor, however this has not helped.  The history is provided by the patient.    Past Medical History:  Diagnosis Date  . Anal fistula     There are no active problems to display for this patient.   Past Surgical History:  Procedure Laterality Date  . ANAL FISTULOTOMY N/A 03/30/2018   Procedure: FISTULOTOMY;  Surgeon: Leighton Ruff, MD;  Location: [facility];  Service: General;  Laterality: N/A;  . LACERATION REPAIR Left 04/13/2017   Left index finger  . RECTAL EXAM UNDER ANESTHESIA N/A 03/30/2018   Procedure: ANAL EXAM UNDER ANESTHESIA;  Surgeon: Leighton Ruff, MD;  Location: [facility];  Service: General;  Laterality: N/A;        Home Medications    Prior to Admission medications   Medication Sig Start Date End Date Taking? Authorizing Provider  acetaminophen (TYLENOL) 500 MG tablet Take 500 mg by mouth every 6 (six) hours as needed for mild pain.    [provider]  butalbital-acetaminophen-caffeine (FIORICET, ESGIC) 50-325-40 MG tablet Take 1-2 tablets by mouth every 6 (six) hours as needed for headache. 04/29/18 04/29/19  Lacretia Leigh, MD  doxycycline (VIBRAMYCIN) 100 MG capsule Take 1 capsule (100 mg total) by mouth 2 (two) times daily. 02/02/18   Zigmund Gottron, NP  naproxen (NAPROSYN) 500 MG tablet Take 1 tablet (500 mg total) by mouth 2 (two) times daily. Patient not taking: Reported on 03/16/2018 12/06/17   Wieters, Madelynn Done C, PA-C  oxyCODONE-acetaminophen (PERCOCET/ROXICET) 5-325 MG tablet Take 1 tablet by mouth every 6 (six) hours as needed for severe pain. 6/0/10   Leighton Ruff, MD  phenylephrine-shark liver oil-mineral oil-petrolatum (PREPARATION H) 0.25-3-14-71.9 % rectal ointment Place 1 application rectally 2 (two) times daily as needed for hemorrhoids.    [provider]    Family History No family history on file.  Social History Social History   Tobacco Use  . Smoking status: Never Smoker  . Smokeless tobacco: Never Used  Substance Use Topics  . Alcohol use: Yes    Comment: Beer  . Drug use: Never     Allergies   Patient has no known allergies.   Review of Systems Review of Systems  All other systems reviewed and are negative.    Physical Exam Updated Vital Signs BP (!) 134/98 (BP Location: Right Arm)   Pulse 80   Temp 99 F (37.2 C) (Oral)   Resp 16   Ht 5\' 4"  (1.626 m)   Wt 95.3 kg   SpO2 99%   BMI 36.05 kg/m   Physical Exam Vitals signs and nursing note reviewed.  Constitutional:      General: He is not in acute distress.  Appearance: Normal appearance. He is not ill-appearing.  HENT:     Head: Normocephalic and atraumatic.  Cardiovascular:     Rate and Rhythm: Normal rate and regular rhythm.  Pulmonary:     Effort: Pulmonary effort is normal. No respiratory distress.     Breath sounds: Normal breath sounds. No wheezing or rhonchi.     Comments: There is tenderness to palpation of the right anterior chest wall just lateral to the lower sternum.  This reproduces his symptoms. Skin:    General: Skin is warm and dry.  Neurological:     Mental Status: He is alert and oriented to person, place, and time.      ED Treatments / Results  Labs (all labs ordered are listed, but  only abnormal results are displayed) Labs Reviewed - No data to display  EKG None  Radiology No results found.  Procedures Procedures (including critical care time)  Medications Ordered in ED Medications - No data to display   Initial Impression / Assessment and Plan / ED Course  I have reviewed the triage vital signs and the nursing notes.  Pertinent labs & imaging results that were available during my care of the patient were reviewed by me and considered in my medical decision making (see chart for details).  Patient presenting with a 68-month history of sharp pain to the right lower anterior chest wall that is most consistent with costochondritis.  It is reproducible with palpation and with movement.  His lungs are clear, vitals are stable, and there is no hypoxia.  I highly doubt PE.  He will be treated with a course of anti-inflammatory medications and is to follow-up with his primary doctor/return if not improving.  Patient also questions whether this could be his gallbladder.  I highly doubt this possibility.  His presentation is not consistent with this and there is no tenderness in the abdomen, nor are there worsening symptoms with eating.  Final Clinical Impressions(s) / ED Diagnoses   Final diagnoses:  None    ED Discharge Orders    None       Geoffery Lyons, MD 01/15/19 2045    Geoffery Lyons, MD 01/15/19 2047

## 2019-01-15 NOTE — ED Notes (Signed)
ED Provider at bedside. 

## 2019-01-15 NOTE — ED Triage Notes (Signed)
Patient c/o right rib pain x 6 months. Reports seen at chiropractor for same. States pain worsens with movement. Denies chest pain, SOB, cough, and fever. Reports heavy lifting at work daily.

## 2019-01-15 NOTE — Discharge Instructions (Addendum)
Begin taking naproxen as prescribed.  Rest.  Follow-up with your primary doctor if symptoms or not improving in the next 1 to 2 weeks, and return to the ER if you develop worsening pain, high fever, difficulty breathing, or other new and concerning symptoms.

## 2020-01-08 IMAGING — CT CT PELVIS W/ CM
2 of 3 series · 15 of 46 positions shown, 17 images · IV contrast (ISOVUE)
Comparison: None.

CLINICAL DATA: Pt c/o left side rectal pain for 2 weeks worse with
sitting and walking. Tried tylenol and cream for hemorrhoids but
didn't help. Denies blood in stools or rectal bleeding.

EXAM:
CT PELVIS WITH CONTRAST
TECHNIQUE: Multidetector CT imaging of the pelvis was performed using the
standard protocol following the bolus administration of intravenous
contrast.
CONTRAST:  100mL 99FVY3-SJJ IOPAMIDOL (99FVY3-SJJ) INJECTION 61%

[Series 2: axial st · axial · 0.69mm/px · z∈[-496,-281]mm · 12 of 51 slices shown, 14 images]
[im 4/51  soft-tissue]
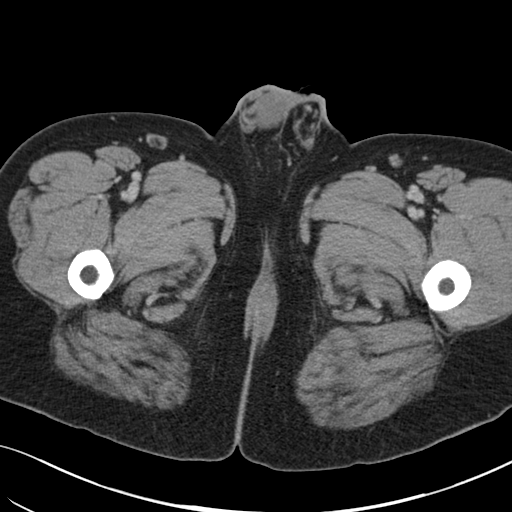
[im 4/51  bone]
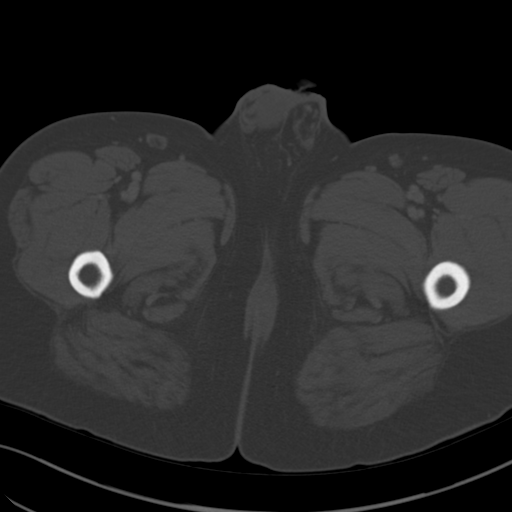
[im 7/51  soft-tissue]
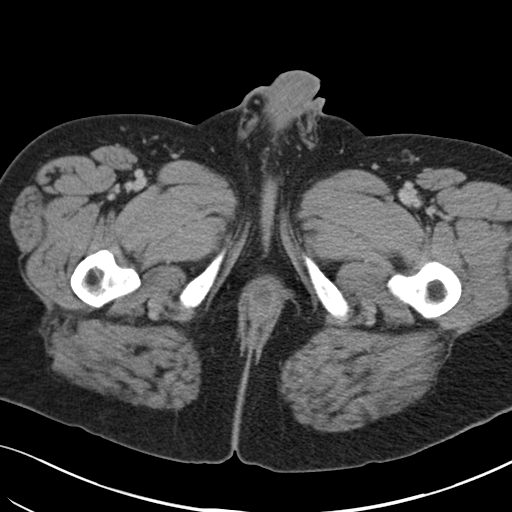
[im 12/51  soft-tissue]
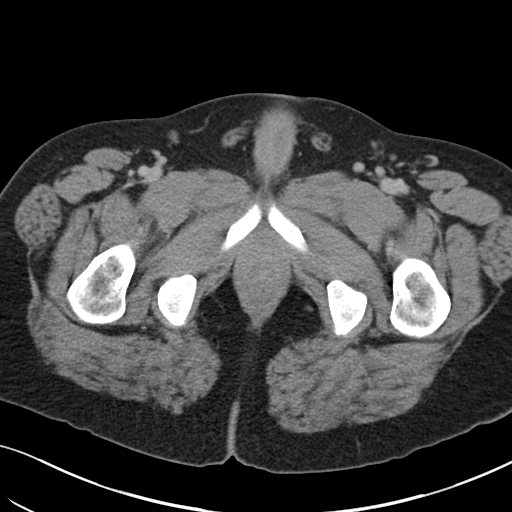
[im 15/51  soft-tissue]
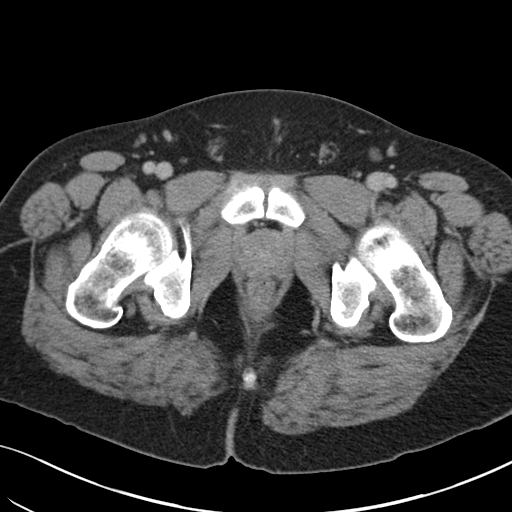
[im 20/51  soft-tissue]
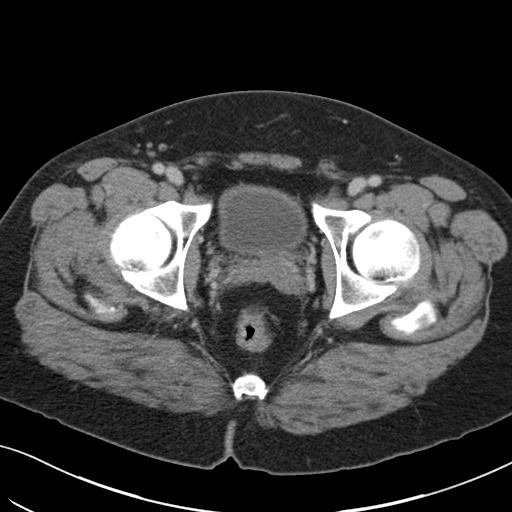
[im 23/51  soft-tissue]
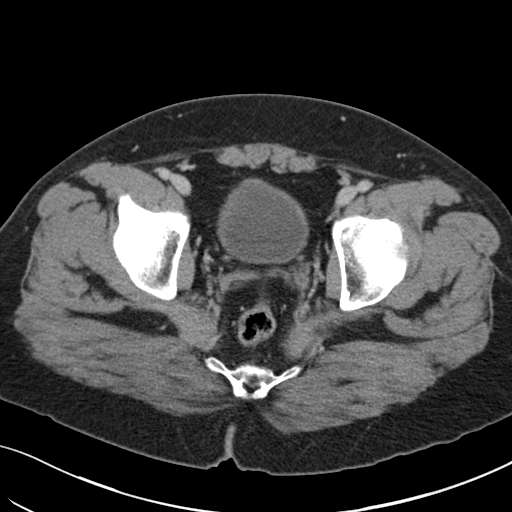
[im 28/51  soft-tissue]
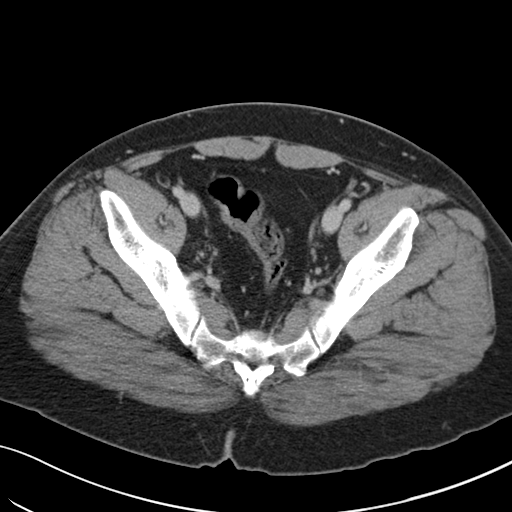
[im 31/51  soft-tissue]
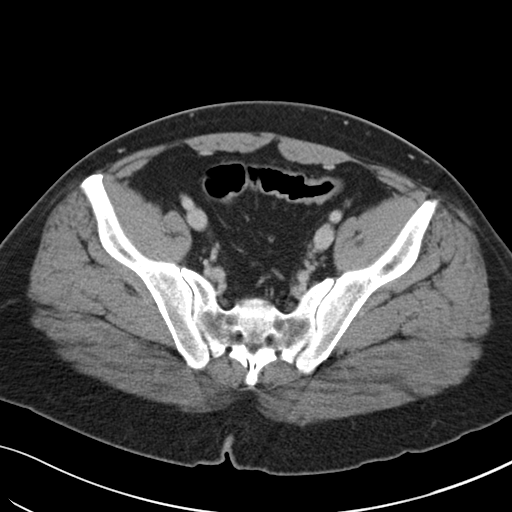
[im 36/51  soft-tissue]
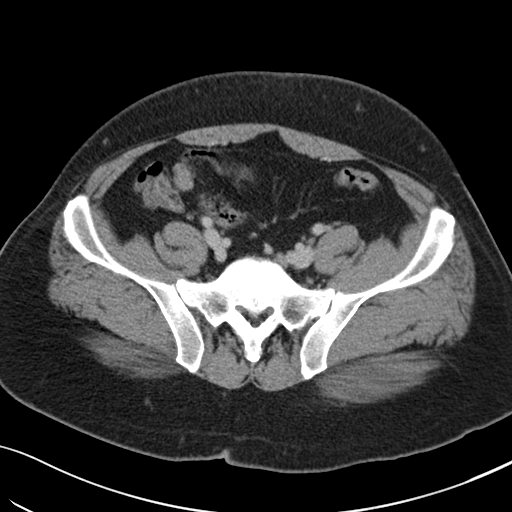
[im 36/51  bone]
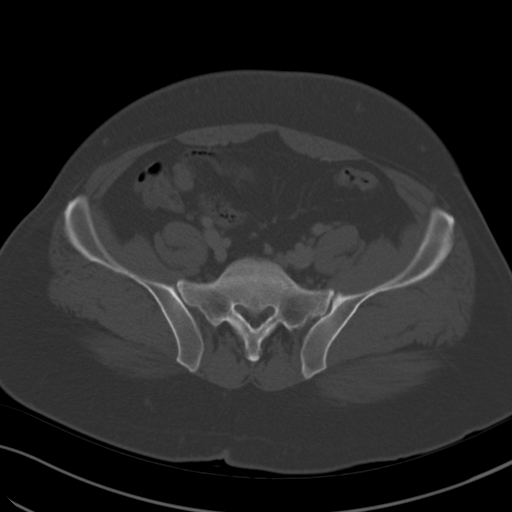
[im 39/51  soft-tissue]
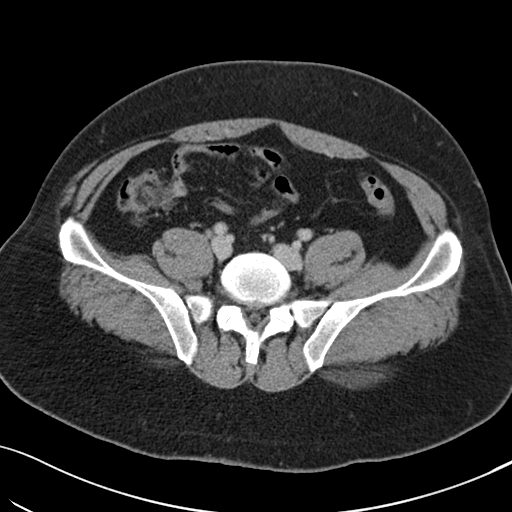
[im 44/51  soft-tissue]
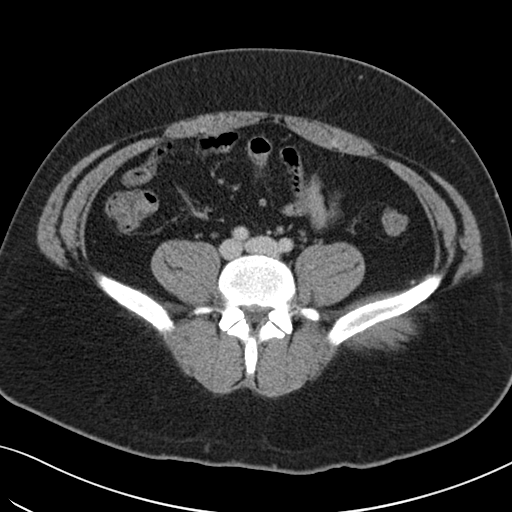
[im 47/51  soft-tissue]
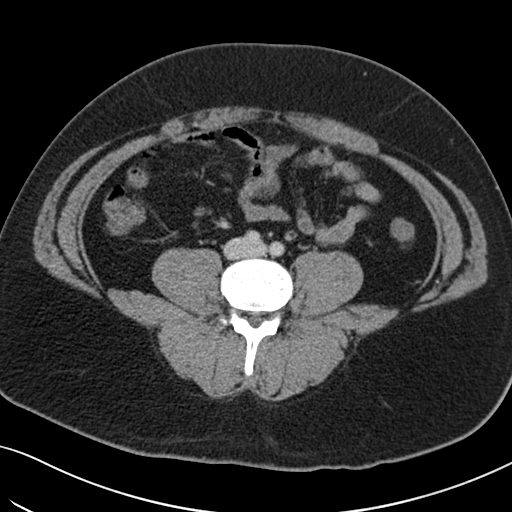

[Series 4: coronal st · coronal · 0.50mm/px · 3 of 101 slices shown]
[im 34/101  soft-tissue]
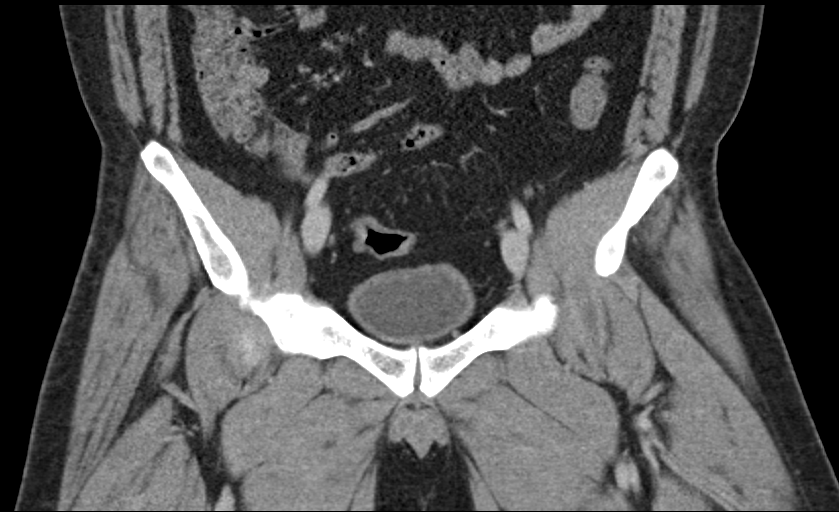
[im 45/101  soft-tissue]
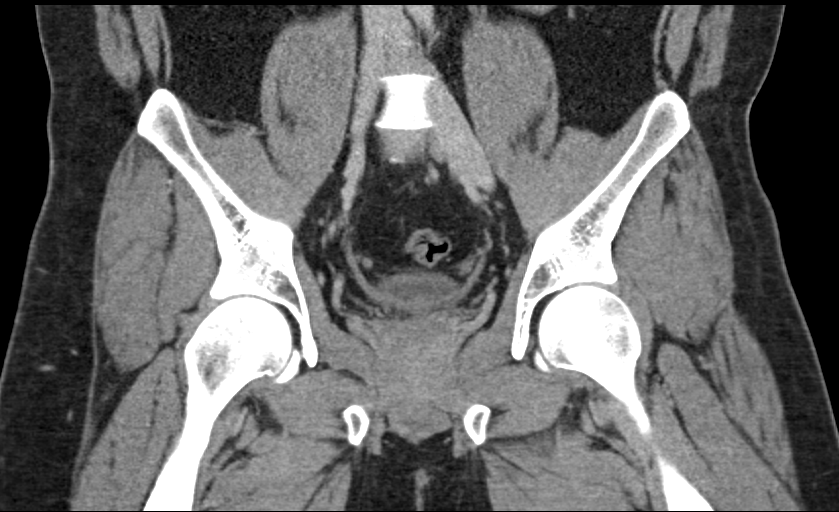
[im 56/101  soft-tissue]
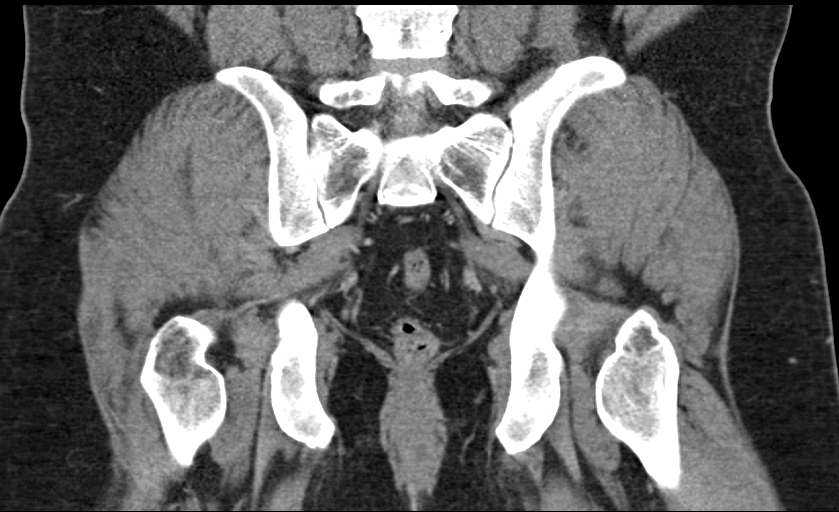

[15 of 46 positions shown; findings below may reference images not displayed]

FINDINGS: Urinary Tract: Bladder is unremarkable. Distal ureters normal in
course and in caliber.

Bowel: No rectal wall thickening or adjacent inflammation.
Visualized colon and small bowel are unremarkable. Normal appendix
visualized.

There is a subtle low-density lesion projecting within the anus
measuring 12 mm [REDACTED] be due to a hemorrhoid.

Vascular/Lymphatic: No vascular abnormality. No pathologically
enlarged lymph nodes.

Reproductive:  Normal sized prostate gland.

Other:  None.

Musculoskeletal: No skeletal abnormality.
IMPRESSION: 1. Small apparent cystic lesion measuring 12 mm projects within the
anus. This could potentially reflect an inflamed anal gland or
hemorrhoid.
2. No other abnormality.  No rectal wall thickening or inflammation.

## 2020-12-23 ENCOUNTER — Ambulatory Visit (HOSPITAL_COMMUNITY)
Admission: EM | Admit: 2020-12-23 | Discharge: 2020-12-23 | Disposition: A | Discharge status: Home / Self Care | Payer: Self-pay | Attending: Student | Admitting: Student

## 2020-12-23 ENCOUNTER — Encounter (HOSPITAL_COMMUNITY): Payer: Self-pay

## 2020-12-23 ENCOUNTER — Other Ambulatory Visit: Payer: Self-pay

## 2020-12-23 DIAGNOSIS — S161XXA Strain of muscle, fascia and tendon at neck level, initial encounter: Secondary | ICD-10-CM

## 2020-12-23 MED ORDER — TIZANIDINE HCL 2 MG PO TABS: 2.0000 mg | ORAL_TABLET | Freq: Three times a day (TID) | ORAL | 0 refills | Status: AC | PRN

## 2020-12-23 MED ORDER — PREDNISONE 20 MG PO TABS: 40.0000 mg | ORAL_TABLET | Freq: Every day | ORAL | 0 refills | Status: AC

## 2020-12-23 NOTE — ED Triage Notes (Signed)
Pt c/o hitting the lt side of back of head and neck on a shelf at work over a month ago. States had neg xrays at the chiropractor a month ago. Pt c/o pain when turning his head to the left.

## 2020-12-23 NOTE — ED Provider Notes (Signed)
[facility]    CSN: [id number] Arrival date & time: 12/23/20  1815      History   Chief Complaint Chief Complaint  Patient presents with   Neck Injury    HPI Edward Lewis is a 35 y.o. male presenting with continued neck pain for about 1 month following hitting the back of his head and neck on a shelf at work.  Negative x-rays at the chiropractor, and pain has largely resolved, but he is concerned that he still having some pain when he turns his head to the left.  Denies radiation of pain down arms or legs, denies weakness in arms or legs, denies headaches or vision changes.  HPI  Past Medical History:  Diagnosis Date   Anal fistula     There are no problems to display for this patient.   Past Surgical History:  Procedure Laterality Date   ANAL FISTULOTOMY N/A 03/30/2018   Procedure: FISTULOTOMY;  Surgeon: Romie Levee, MD;  Location: [facility];  Service: General;  Laterality: N/A;   LACERATION REPAIR Left 04/13/2017   Left index finger   RECTAL EXAM UNDER ANESTHESIA N/A 03/30/2018   Procedure: ANAL EXAM UNDER ANESTHESIA;  Surgeon: Romie Levee, MD;  Location: [facility];  Service: General;  Laterality: N/A;       Home Medications    Prior to Admission medications   Medication Sig Start Date End Date Taking? Authorizing Provider  predniSONE (DELTASONE) 20 MG tablet Take 2 tablets (40 mg total) by mouth daily for 5 days. Take with breakfast or lunch. Avoid NSAIDs (ibuprofen, etc) while taking this medication. 12/23/20 12/28/20 Yes Rhys Martini, PA-C  tiZANidine (ZANAFLEX) 2 MG tablet Take 1 tablet (2 mg total) by mouth every 8 (eight) hours as needed for muscle spasms. 12/23/20  Yes Rhys Martini, PA-C  acetaminophen (TYLENOL) 500 MG tablet Take 500 mg by mouth every 6 (six) hours as needed for mild pain.    [provider]  phenylephrine-shark liver oil-mineral oil-petrolatum (PREPARATION H)  0.25-3-14-71.9 % rectal ointment Place 1 application rectally 2 (two) times daily as needed for hemorrhoids.    [provider]    Family History History reviewed. No pertinent family history.  Social History Social History   Tobacco Use   Smoking status: Never   Smokeless tobacco: Never  Vaping Use   Vaping Use: Never used  Substance Use Topics   Alcohol use: Yes    Comment: Beer   Drug use: Never     Allergies   Patient has no known allergies.   Review of Systems Review of Systems  Musculoskeletal:  Positive for neck pain.  All other systems reviewed and are negative.   Physical Exam Triage Vital Signs ED Triage Vitals  Enc Vitals Group     BP 12/23/20 1856 139/69     Pulse Rate 12/23/20 1856 89     Resp 12/23/20 1856 18     Temp 12/23/20 1856 98.9 F (37.2 C)     Temp Source 12/23/20 1856 Oral     SpO2 12/23/20 1856 98 %     Weight --      Height --      Head Circumference --      Peak Flow --      Pain Score 12/23/20 1858 3     Pain Loc --      Pain Edu? --      Excl. in GC? --  No data found.  Updated Vital Signs BP 139/69 (BP Location: Left Arm)   Pulse 89   Temp 98.9 F (37.2 C) (Oral)   Resp 18   SpO2 98%   Visual Acuity Right Eye Distance:   Left Eye Distance:   Bilateral Distance:    Right Eye Near:   Left Eye Near:    Bilateral Near:     Physical Exam Vitals reviewed.  Constitutional:      General: He is not in acute distress.    Appearance: Normal appearance. He is not ill-appearing or diaphoretic.  HENT:     Head: Normocephalic and atraumatic.  Cardiovascular:     Rate and Rhythm: Normal rate and regular rhythm.     Heart sounds: Normal heart sounds.  Pulmonary:     Effort: Pulmonary effort is normal.     Breath sounds: Normal breath sounds and air entry.  Abdominal:     Tenderness: There is no abdominal tenderness. There is no right CVA tenderness, left CVA tenderness, guarding or rebound.  Musculoskeletal:      Cervical back: Normal range of motion. Spasms and tenderness present. No swelling, deformity, signs of trauma, rigidity, bony tenderness or crepitus. No pain with movement.     Thoracic back: No swelling, deformity, signs of trauma, spasms, tenderness or bony tenderness. Normal range of motion. No scoliosis.     Lumbar back: No swelling, deformity, signs of trauma, spasms, tenderness or bony tenderness. Normal range of motion. Negative right straight leg raise test and negative left straight leg raise test. No scoliosis.     Comments: Left-sided cervical paraspinous muscle tenderness to palpation.  Pain elicited with turning chin to the left.  Also pain elicited with chin to chest.  Strength and sensation intact upper and lower extremities, gait intact. No midline spinous tenderness, deformity, stepoff.  Absolutely no other injury, deformity, tenderness, ecchymosis, abrasion.  Skin:    General: Skin is warm.  Neurological:     General: No focal deficit present.     Mental Status: He is alert and oriented to person, place, and time.     Cranial Nerves: No cranial nerve deficit.  Psychiatric:        Mood and Affect: Mood normal.        Behavior: Behavior normal.        Thought Content: Thought content normal.        Judgment: Judgment normal.     UC Treatments / Results  Labs (all labs ordered are listed, but only abnormal results are displayed) Labs Reviewed - No data to display  EKG   Radiology No results found.  Procedures Procedures (including critical care time)  Medications Ordered in UC Medications - No data to display  Initial Impression / Assessment and Plan / UC Course  I have reviewed the triage vital signs and the nursing notes.  Pertinent labs & imaging results that were available during my care of the patient were reviewed by me and considered in my medical decision making (see chart for details).     This patient is a very pleasant 35 y.o. year old male  presenting with cervical strain following neck injury that occurred 1 month ago.  No red flag symptoms.  Negative xrays 1 month ago at chiropractor per pt, agrees to defer additional imaging today.  Will proceed with prednisone and Zanaflex.  Follow-up with [facility] if symptoms persist.  ED return precautions discussed. Patient verbalizes understanding and agreement.    Final  Clinical Impressions(s) / UC Diagnoses   Final diagnoses:  Strain of neck muscle, initial encounter     Discharge Instructions      -Start the muscle relaxer-Zanaflex (tizanidine), up to 3 times daily for muscle spasms and pain.  This can make you drowsy, so take at bedtime or when you do not need to drive or operate machinery. -Prednisone, 2 pills taken at the same time for 5 days in a row.  Try taking this earlier in the day as it can give you energy. Avoid NSAIDs like ibuprofen and alleve while taking this medication as they can increase your risk of stomach upset and even GI bleeding when in combination with a steroid. You can continue tylenol (acetaminophen) up to 1000mg  3x daily. -If symptoms persist in 5-7 days, follow-up with an orthopedist. I recommend [facility] at [address], [place], [place]. You can schedule an appointment by calling [phone]) or online [phone]), but they also have a walk-in clinic M-F 8a-8p and Sat 10a-3p.      ED Prescriptions     Medication Sig Dispense Auth. Provider   predniSONE (DELTASONE) 20 MG tablet Take 2 tablets (40 mg total) by mouth daily for 5 days. Take with breakfast or lunch. Avoid NSAIDs (ibuprofen, etc) while taking this medication. 10 tablet [email or web address]/, PA-C   tiZANidine (ZANAFLEX) 2 MG tablet Take 1 tablet (2 mg total) by mouth every 8 (eight) hours as needed for muscle spasms. 21 tablet Rhys Martini, PA-C      PDMP not reviewed this encounter.   Rhys Martini, PA-C 12/23/20 1924

## 2020-12-23 NOTE — Discharge Instructions (Addendum)
-  Start the muscle relaxer-Zanaflex (tizanidine), up to 3 times daily for muscle spasms and pain.  This can make you drowsy, so take at bedtime or when you do not need to drive or operate machinery. -Prednisone, 2 pills taken at the same time for 5 days in a row.  Try taking this earlier in the day as it can give you energy. Avoid NSAIDs like ibuprofen and alleve while taking this medication as they can increase your risk of stomach upset and even GI bleeding when in combination with a steroid. You can continue tylenol (acetaminophen) up to 1000mg  3x daily. -If symptoms persist in 5-7 days, follow-up with an orthopedist. I recommend [facility] at [address] [place], [place]. You can schedule an appointment by calling [phone]) or online [phone]), but they also have a walk-in clinic M-F 8a-8p and Sat 10a-3p.

## 2021-01-13 ENCOUNTER — Other Ambulatory Visit: Payer: Self-pay | Admitting: Nurse Practitioner

## 2021-01-13 ENCOUNTER — Ambulatory Visit
Admission: RE | Admit: 2021-01-13 | Discharge: 2021-01-13 | Disposition: A | Discharge status: Home / Self Care | Payer: Self-pay | Source: Ambulatory Visit | Attending: Nurse Practitioner | Admitting: Nurse Practitioner

## 2021-01-13 ENCOUNTER — Other Ambulatory Visit: Payer: Self-pay

## 2021-01-13 DIAGNOSIS — R0989 Other specified symptoms and signs involving the circulatory and respiratory systems: Secondary | ICD-10-CM

## 2021-09-26 ENCOUNTER — Emergency Department (HOSPITAL_COMMUNITY)
Admission: EM | Admit: 2021-09-26 | Discharge: 2021-09-26 | Discharge status: Against Medical Advice | Payer: Self-pay | Attending: Emergency Medicine | Admitting: Emergency Medicine

## 2021-09-26 ENCOUNTER — Other Ambulatory Visit: Payer: Self-pay

## 2021-09-26 DIAGNOSIS — Z5321 Procedure and treatment not carried out due to patient leaving prior to being seen by health care provider: Secondary | ICD-10-CM | POA: Insufficient documentation

## 2021-09-26 DIAGNOSIS — R0602 Shortness of breath: Secondary | ICD-10-CM | POA: Insufficient documentation

## 2021-09-26 DIAGNOSIS — M542 Cervicalgia: Secondary | ICD-10-CM | POA: Insufficient documentation

## 2021-09-26 DIAGNOSIS — R1310 Dysphagia, unspecified: Secondary | ICD-10-CM | POA: Insufficient documentation

## 2021-09-26 NOTE — ED Triage Notes (Signed)
Patient states when he eats he feels like food is not going all the way down , says he feels like something is stuck in chest, says when he breathes he cannot breathe air all the way in. Patient states this has been going on for a few weeks. Denies numbness/tingling, denies n/v, denies dizzines

## 2021-09-26 NOTE — ED Provider Triage Note (Signed)
Emergency Medicine Provider Triage Evaluation Note  Edward Lewis , a 36 y.o. male  was evaluated in triage.  Pt complains of difficulty swallowing for several months. Feels as though his food is "not going all the way down" and feels stuck in his chest with associated shortness of breath that occurs 2x/week. Also reports left sided neck pain x several months.   Review of Systems  Positive: Dysphagia, left sided neck pain Negative: Numbness, tingling, abdominal pain, N/V, dizziness  Physical Exam  BP (!) 148/91 (BP Location: Right Arm)   Pulse 72   Temp 98.8 F (37.1 C) (Oral)   Resp 18   Ht 5\' 4"  (1.626 m)   Wt 95.3 kg   SpO2 100%   BMI 36.05 kg/m  Gen:   Awake, no distress   Resp:  Normal effort  MSK:   Moves extremities without difficulty  Other:  Tolerating own secretions  Medical Decision Making  Medically screening exam initiated at 12:18 PM.  Appropriate orders placed.  Edward Lewis was informed that the remainder of the evaluation will be completed by another provider, this initial triage assessment does not replace that evaluation, and the importance of remaining in the ED until their evaluation is complete.     Edward Lewis T, PA-C 09/26/21 1219

## 2023-04-04 ENCOUNTER — Encounter (HOSPITAL_COMMUNITY): Payer: Self-pay

## 2023-04-04 ENCOUNTER — Other Ambulatory Visit: Payer: Self-pay

## 2023-04-04 ENCOUNTER — Emergency Department (HOSPITAL_COMMUNITY)
Admission: EM | Admit: 2023-04-04 | Discharge: 2023-04-04 | Disposition: A | Discharge status: Home / Self Care | Payer: Self-pay | Attending: Emergency Medicine | Admitting: Emergency Medicine

## 2023-04-04 DIAGNOSIS — L02411 Cutaneous abscess of right axilla: Secondary | ICD-10-CM | POA: Insufficient documentation

## 2023-04-04 DIAGNOSIS — L0291 Cutaneous abscess, unspecified: Secondary | ICD-10-CM

## 2023-04-04 MED ORDER — BUPIVACAINE HCL (PF) 0.5 % IJ SOLN
10.0000 mL | Freq: Once | INTRAMUSCULAR | Status: AC
  Administered 2023-04-04: 10 mL
  Filled 2023-04-04: qty 30

## 2023-04-04 MED ORDER — DOXYCYCLINE HYCLATE 100 MG PO CAPS: 100.0000 mg | ORAL_CAPSULE | Freq: Two times a day (BID) | ORAL | 0 refills | Status: AC

## 2023-04-04 MED ORDER — DOXYCYCLINE HYCLATE 100 MG PO TABS
100.0000 mg | ORAL_TABLET | Freq: Once | ORAL | Status: AC
  Administered 2023-04-04: 100 mg via ORAL
  Filled 2023-04-04: qty 1

## 2023-04-04 NOTE — ED Triage Notes (Addendum)
 Patient reports abscess under right arm. Denies fevers.

## 2023-04-04 NOTE — Discharge Instructions (Addendum)
 Today you were seen for an abscess near your right axilla.  Please pick up your antibiotics and take as prescribed.  You may also alternate taking Tylenol  and Motrin as needed for pain.  Please do not take Motrin for greater than 5 days neurosis may cause rebound headaches.  Please return to the ER if you experience fever, chills, signs of infection, or the abscess comes back.  Thank you for letting us  treat you today. After performing a physical exam and incision and drainage, I feel you are safe to go home. Please follow up with your PCP in the next several days and provide them with your records from this visit. Return to the Emergency Room if pain becomes severe or symptoms worsen.

## 2023-04-04 NOTE — ED Provider Notes (Signed)
 [name] Provider Note   CSN: [id number] Arrival date & time: 04/04/23  9071     History  Chief Complaint  Patient presents with   Abscess    Edward Lewis is a 38 y.o. male past medical history for anal fistula presents today for an abscess under his right arm.  Patient states he has had this for couple months and has gotten bigger and smaller but approximately 3 days ago he noticed that his gotten big and red and now notices a white spot on it.  Patient denies fever, drainage, chills or any other complaints at this time.   Abscess      Home Medications Prior to Admission medications   Medication Sig Start Date End Date Taking? Authorizing Provider  doxycycline  (VIBRAMYCIN ) 100 MG capsule Take 1 capsule (100 mg total) by mouth 2 (two) times daily. 04/04/23  Yes Yadriel Kerrigan N, PA-C  acetaminophen  (TYLENOL ) 500 MG tablet Take 500 mg by mouth every 6 (six) hours as needed for mild pain.    [provider]  phenylephrine-shark liver oil-mineral oil-petrolatum (PREPARATION H) 0.25-3-14-71.9 % rectal ointment Place 1 application rectally 2 (two) times daily as needed for hemorrhoids.    [provider]  tiZANidine  (ZANAFLEX ) 2 MG tablet Take 1 tablet (2 mg total) by mouth every 8 (eight) hours as needed for muscle spasms. 12/23/20   Graham, Laura E, PA-C      Allergies    Patient has no known allergies.    Review of Systems   Review of Systems  Skin:  Positive for wound.    Physical Exam Updated Vital Signs BP 135/78 (BP Location: Left Arm)   Pulse 81   Temp 98 F (36.7 C)   Resp 18   Ht 5' 4 (1.626 m)   Wt 97.5 kg   SpO2 100%   BMI 36.90 kg/m  Physical Exam Vitals and nursing note reviewed.  Constitutional:      General: He is not in acute distress.    Appearance: He is well-developed.  HENT:     Head: Normocephalic and atraumatic.     Right Ear: External ear normal.     Left Ear:  External ear normal.     Nose: Nose normal.  Eyes:     Conjunctiva/sclera: Conjunctivae normal.  Cardiovascular:     Rate and Rhythm: Normal rate and regular rhythm.     Pulses: Normal pulses.     Heart sounds: No murmur heard. Pulmonary:     Effort: Pulmonary effort is normal. No respiratory distress.     Breath sounds: Normal breath sounds.  Abdominal:     Palpations: Abdomen is soft.  Musculoskeletal:        General: No swelling.     Cervical back: Neck supple.  Skin:    General: Skin is warm and dry.     Capillary Refill: Capillary refill takes less than 2 seconds.     Comments: Patient has approximately 2 and half centimeter area of erythema just distal to the right axilla with fluctuance.  There is no drainage currently seen.  Neurological:     General: No focal deficit present.     Mental Status: He is alert.     Motor: No weakness.  Psychiatric:        Mood and Affect: Mood normal.     ED Results / Procedures / Treatments   Labs (all labs ordered are listed, but only abnormal results  are displayed) Labs Reviewed - No data to display  EKG None  Radiology No results found.  Procedures .Incision and Drainage  Date/Time: 04/04/2023 1:50 PM  Performed by: Francis Ileana SAILOR, PA-C Authorized by: Francis Ileana SAILOR, PA-C   Consent:    Consent obtained:  Verbal   Consent given by:  Patient   Risks, benefits, and alternatives were discussed: yes     Risks discussed:  Bleeding, incomplete drainage, pain and infection   Alternatives discussed:  No treatment Universal protocol:    Patient identity confirmed:  Verbally with patient Location:    Type:  Abscess   Size:  3 cm   Location:  Trunk   Trunk location: Right axilla. Pre-procedure details:    Skin preparation:  Povidone-iodine Sedation:    Sedation type:  None Anesthesia:    Anesthesia method:  Local infiltration   Local anesthetic:  Bupivacaine  0.5% w/o epi Procedure type:    Complexity:  Simple Procedure  details:    Incision types:  Stab incision   Drainage:  Purulent and bloody   Drainage amount:  Moderate   Wound treatment:  Wound left open   Packing materials:  None Post-procedure details:    Procedure completion:  Tolerated     Medications Ordered in ED Medications  doxycycline  (VIBRA -TABS) tablet 100 mg (100 mg Oral Given 04/04/23 1337)  bupivacaine (PF) (MARCAINE ) 0.5 % injection 10 mL (10 mLs Infiltration Given 04/04/23 1351)    ED Course/ Medical Decision Making/ A&P                                 Medical Decision Making  This patient presents to the ED with chief complaint(s) of abscess with pertinent past medical history of anal fistula which further complicates the presenting complaint. The complaint involves an extensive differential diagnosis and also carries with it a high risk of complications and morbidity.    The differential diagnosis includes cellulitis, abscess, necrotizing fasciitis  Additional history obtained: Additional history obtained from spouse Records reviewed Care Everywhere/External Records  ED Course and Reassessment: Patient given first dose of doxycycline  in ER  Consultation: - Consulted or discussed management/test interpretation w/ external professional: None  Consideration for admission or further workup: Considered for mission further However patient's vital signs and physical exam are reassuring.  Patient given outpatient course of doxycycline  and advised to follow-up with PCP in approximately 1 week to assess abscess healing.  Patient given strict return precautions.        Final Clinical Impression(s) / ED Diagnoses Final diagnoses:  Abscess    Rx / DC Orders ED Discharge Orders          Ordered    doxycycline  (VIBRAMYCIN ) 100 MG capsule  2 times daily        04/04/23 1328              Francis Ileana SAILOR, PA-C 04/04/23 1352    Kingsley, Victoria K, DO 04/04/23 1356

## 2023-04-04 NOTE — ED Notes (Signed)
 Wound care performed on pt to prepare him for discharge.
